# Patient Record
Sex: Female | Born: 1970 | Race: Black or African American | Hispanic: No | Marital: Single | Smoking: Former smoker
Health system: Southern US, Community
[De-identification: ages and names within clinical notes are randomized; demographics above are authoritative.]

## PROBLEM LIST (undated history)

## (undated) DIAGNOSIS — E782 Mixed hyperlipidemia: Secondary | ICD-10-CM

## (undated) DIAGNOSIS — D509 Iron deficiency anemia, unspecified: Secondary | ICD-10-CM

## (undated) DIAGNOSIS — I10 Essential (primary) hypertension: Secondary | ICD-10-CM

## (undated) DIAGNOSIS — Z6841 Body Mass Index (BMI) 40.0 and over, adult: Secondary | ICD-10-CM

## (undated) DIAGNOSIS — D251 Intramural leiomyoma of uterus: Secondary | ICD-10-CM

## (undated) HISTORY — PX: OTHER SURGICAL HISTORY: SHX169

## (undated) HISTORY — PX: ABDOMINAL HYSTERECTOMY: SHX81

---

## 2012-02-23 ENCOUNTER — Other Ambulatory Visit (HOSPITAL_COMMUNITY): Payer: Self-pay | Admitting: Obstetrics and Gynecology

## 2012-02-23 DIAGNOSIS — O09529 Supervision of elderly multigravida, unspecified trimester: Secondary | ICD-10-CM

## 2012-02-23 DIAGNOSIS — Z3689 Encounter for other specified antenatal screening: Secondary | ICD-10-CM

## 2012-02-23 DIAGNOSIS — E669 Obesity, unspecified: Secondary | ICD-10-CM

## 2012-03-01 ENCOUNTER — Other Ambulatory Visit: Payer: Self-pay

## 2012-03-01 ENCOUNTER — Encounter (HOSPITAL_COMMUNITY): Payer: Self-pay

## 2012-03-01 ENCOUNTER — Ambulatory Visit (HOSPITAL_COMMUNITY)
Admission: RE | Admit: 2012-03-01 | Discharge: 2012-03-01 | Disposition: A | Discharge status: Home / Self Care | Payer: Medicaid Other | Source: Ambulatory Visit | Attending: Obstetrics and Gynecology | Admitting: Obstetrics and Gynecology

## 2012-03-01 DIAGNOSIS — O10019 Pre-existing essential hypertension complicating pregnancy, unspecified trimester: Secondary | ICD-10-CM

## 2012-03-01 DIAGNOSIS — O09529 Supervision of elderly multigravida, unspecified trimester: Secondary | ICD-10-CM | POA: Insufficient documentation

## 2012-03-01 DIAGNOSIS — Z3689 Encounter for other specified antenatal screening: Secondary | ICD-10-CM

## 2012-03-01 DIAGNOSIS — IMO0002 Reserved for concepts with insufficient information to code with codable children: Secondary | ICD-10-CM | POA: Insufficient documentation

## 2012-03-01 DIAGNOSIS — O99210 Obesity complicating pregnancy, unspecified trimester: Secondary | ICD-10-CM | POA: Insufficient documentation

## 2012-03-01 DIAGNOSIS — E669 Obesity, unspecified: Secondary | ICD-10-CM

## 2012-03-01 HISTORY — DX: Essential (primary) hypertension: I10

## 2012-03-01 NOTE — Progress Notes (Signed)
Genetic Counseling  High-Risk Gestation Note  Appointment Date:  03/01/2012 Referred By: Earleen Newport, MD Date of Birth:  1970-05-31  Pregnancy History: [id number] Estimated Date of Delivery: 07/31/12 Estimated Gestational Age: [redacted]w[redacted]d Attending: Particia Nearing, MD  I met with Ms. Sandra Knight for genetic counseling because of a maternal age of 41 y.o..     She was counseled regarding maternal age and the association with risk for chromosome conditions due to nondisjunction with aging of the ova.   We reviewed chromosomes, nondisjunction, and the associated 1 in 23 risk for fetal aneuploidy related to a maternal age of 41 y.o. at [redacted]w[redacted]d gestation.  She was counseled that the risk for aneuploidy decreases as gestational age increases, accounting for those pregnancies which spontaneously abort.  We specifically discussed Down syndrome (trisomy 95), trisomies 59 and 51, and sex chromosome aneuploidies (47,XXX and 47,XXY) including the common features and prognoses of each.   We reviewed available screening options including Quad screen, noninvasive prenatal testing (NIPT), and detailed ultrasound. She understands that screening tests are used to modify a patient's a priori risk for aneuploidy, typically based on age.  This estimate provides a pregnancy specific risk assessment.  Specifically, we discussed that NIPT analyzes cell free fetal DNA found in the maternal circulation. This test is not diagnostic for chromosome conditions, but can provide information regarding the presence or absence of extra fetal DNA for chromosomes 13, 18, 21, X, and Y, and missing fetal DNA for chromosome X (Turner syndrome) and Y. Thus, it would not identify or rule out all genetic conditions. The reported detection rate is greater than 99% for Trisomy 21, greater than 98% for Trisomy 18, and is approximately 80% (8 out of 10) for Trisomy 13. The false positive rate is reported to be less than 0.1% for any of these conditions.   In addition, we discussed that ~50-80% of fetuses with Down syndrome and up to 90-95% of fetuses with trisomy 18/13, when well visualized, have detectable anomalies or soft markers by detailed ultrasound (~18+ weeks gestation).   She was also counseled regarding diagnostic testing via amniocentesis.  We reviewed the approximate 1 in 300-500 risk for complications for amniocentesis, including spontaneous pregnancy loss. We discussed the risks, limitations, and benefits of each screening and testing option. After consideration of all the options, and a clear understanding of the newness and limitations of NIPT, she elected to proceed with cell free fetal DNA testing. She reported that she had previously had her blood drawn at her primary OB's office for [facility], but that a result was not obtained.  We discussed that results from this redraw should be available in ~8-10 days.  She also expressed interest in having a detailed ultrasound.  A complete ultrasound was performed today.  The ultrasound report will be documented separately. She understands that ultrasound cannot rule out all birth defects or genetic syndromes. The patient was advised of this limitation and states she still does not want diagnostic testing at this time.   Ms. Zaffino was provided with written information regarding sickle cell anemia (SCA) including the carrier frequency and incidence in the African-American population, the availability of carrier testing and prenatal diagnosis if indicated.  In addition, we discussed that hemoglobinopathies are routinely screened for as part of the [facility] newborn screening panel.  She declined hemoglobin electrophoresis today.  Both family histories were reviewed and found to be noncontributory for birth defects, mental retardation, and known genetic conditions. Without further information regarding the provided family  history, an accurate genetic risk cannot be calculated. Further genetic counseling is  warranted if more information is obtained.  Ms. Konecny denied exposure to environmental toxins or chemical agents. She denied the use of alcohol, tobacco or street drugs. She denied significant viral illnesses during the course of her pregnancy. Her medical and surgical histories were contributory for a prior TAB, two prior c/s, CHTN, and obesity.   I counseled Ms. Towry-Tagoe regarding the above risks and available options.  The approximate face-to-face time with the genetic counselor was 42 minutes.  Donald Prose, MS Certified Genetic Counselor

## 2012-03-20 ENCOUNTER — Telehealth (HOSPITAL_COMMUNITY): Payer: Self-pay

## 2012-03-20 NOTE — Telephone Encounter (Signed)
Called Sandra Knight to discuss her [facility], cell free fetal DNA testing. Testing was offered because of a maternal age of 68.  The patient was identified by name and DOB. We discussed that the sample did not yield a result due to a low percentage of fetal DNA fraction.  This is the second failed sample.  We discussed that there is usually a second peak for fetal DNA at ~25-[redacted] weeks gestation.  Sandra Knight declined further testing at this time.  She will return for a follow up ultrasound to complete the fetal anatomy.  All questions were answered to her satisfaction, she was encouraged to call with additional questions or concerns.  Despina Arias, MS Certified Genetic Counselor

## 2012-04-12 ENCOUNTER — Ambulatory Visit (HOSPITAL_COMMUNITY)
Admission: RE | Admit: 2012-04-12 | Discharge: 2012-04-12 | Disposition: A | Discharge status: Home / Self Care | Payer: Medicaid Other | Source: Ambulatory Visit | Attending: Obstetrics and Gynecology | Admitting: Obstetrics and Gynecology

## 2012-04-12 DIAGNOSIS — E669 Obesity, unspecified: Secondary | ICD-10-CM | POA: Insufficient documentation

## 2012-04-12 DIAGNOSIS — O10019 Pre-existing essential hypertension complicating pregnancy, unspecified trimester: Secondary | ICD-10-CM | POA: Insufficient documentation

## 2012-04-12 DIAGNOSIS — O09529 Supervision of elderly multigravida, unspecified trimester: Secondary | ICD-10-CM | POA: Insufficient documentation

## 2012-04-12 DIAGNOSIS — O99210 Obesity complicating pregnancy, unspecified trimester: Secondary | ICD-10-CM | POA: Insufficient documentation

## 2012-04-12 DIAGNOSIS — O34219 Maternal care for unspecified type scar from previous cesarean delivery: Secondary | ICD-10-CM | POA: Insufficient documentation

## 2012-04-12 DIAGNOSIS — Z3689 Encounter for other specified antenatal screening: Secondary | ICD-10-CM | POA: Insufficient documentation

## 2012-04-12 NOTE — Progress Notes (Signed)
Sandra Knight  was seen today for an ultrasound appointment.  See full report in [facility].  Single IUP at 24 2/7 weeks Limited views of the fetal heart and face again obtained due to maternal body habitus No anomalies identified Normal amniotic fluid volume Interval growth is appropriate (57th %tile)  NIPT ([facility] test) returned with insufficient fetal fraction x 2.  The patient declines further aneuploidy testing.  Recommend follow-up ultrasound examination in 4 weeks for interval growth and to reevaluate the fetal heart and face  Alpha Gula, MD

## 2012-05-09 ENCOUNTER — Other Ambulatory Visit (HOSPITAL_COMMUNITY): Payer: Self-pay | Admitting: Obstetrics and Gynecology

## 2012-05-09 DIAGNOSIS — O09529 Supervision of elderly multigravida, unspecified trimester: Secondary | ICD-10-CM

## 2012-05-09 DIAGNOSIS — O99210 Obesity complicating pregnancy, unspecified trimester: Secondary | ICD-10-CM

## 2012-05-10 ENCOUNTER — Ambulatory Visit (HOSPITAL_COMMUNITY)
Admission: RE | Admit: 2012-05-10 | Discharge: 2012-05-10 | Disposition: A | Discharge status: Home / Self Care | Payer: Medicaid Other | Source: Ambulatory Visit | Attending: Obstetrics and Gynecology | Admitting: Obstetrics and Gynecology

## 2012-05-10 DIAGNOSIS — O34219 Maternal care for unspecified type scar from previous cesarean delivery: Secondary | ICD-10-CM | POA: Insufficient documentation

## 2012-05-10 DIAGNOSIS — O09529 Supervision of elderly multigravida, unspecified trimester: Secondary | ICD-10-CM | POA: Insufficient documentation

## 2012-05-10 DIAGNOSIS — E669 Obesity, unspecified: Secondary | ICD-10-CM | POA: Insufficient documentation

## 2012-05-10 DIAGNOSIS — O99210 Obesity complicating pregnancy, unspecified trimester: Secondary | ICD-10-CM

## 2012-05-10 DIAGNOSIS — O10019 Pre-existing essential hypertension complicating pregnancy, unspecified trimester: Secondary | ICD-10-CM | POA: Insufficient documentation

## 2012-05-10 NOTE — Progress Notes (Signed)
Sandra Knight  was seen today for an ultrasound appointment.  See full report in [facility].  Impression: Single IUP at 28 2/7 weeks Normal interval anatomy - some views of the fetal heart limited due to maternal body habitus (LVOT, arches) Interval growth is appropriate (53rd %tile) Normal amniotic fluid volume.  Recommendations: Recommend follow-up ultrasound examination in 4 weeks for interval growth.    Alpha Gula, MD

## 2012-06-06 ENCOUNTER — Other Ambulatory Visit (HOSPITAL_COMMUNITY): Payer: Self-pay | Admitting: Obstetrics and Gynecology

## 2012-06-06 DIAGNOSIS — O09529 Supervision of elderly multigravida, unspecified trimester: Secondary | ICD-10-CM

## 2012-06-07 ENCOUNTER — Ambulatory Visit (HOSPITAL_COMMUNITY): Payer: Medicaid Other

## 2012-06-09 ENCOUNTER — Ambulatory Visit (HOSPITAL_COMMUNITY): Payer: Medicaid Other

## 2012-06-15 ENCOUNTER — Ambulatory Visit (HOSPITAL_COMMUNITY)
Admission: RE | Admit: 2012-06-15 | Discharge: 2012-06-15 | Disposition: A | Discharge status: Home / Self Care | Payer: Medicaid Other | Source: Ambulatory Visit | Attending: Obstetrics and Gynecology | Admitting: Obstetrics and Gynecology

## 2012-06-15 DIAGNOSIS — O09529 Supervision of elderly multigravida, unspecified trimester: Secondary | ICD-10-CM | POA: Insufficient documentation

## 2012-06-15 DIAGNOSIS — O99210 Obesity complicating pregnancy, unspecified trimester: Secondary | ICD-10-CM | POA: Insufficient documentation

## 2012-06-15 DIAGNOSIS — O10019 Pre-existing essential hypertension complicating pregnancy, unspecified trimester: Secondary | ICD-10-CM | POA: Insufficient documentation

## 2012-06-15 DIAGNOSIS — E669 Obesity, unspecified: Secondary | ICD-10-CM | POA: Insufficient documentation

## 2012-06-15 DIAGNOSIS — O34219 Maternal care for unspecified type scar from previous cesarean delivery: Secondary | ICD-10-CM | POA: Insufficient documentation

## 2012-06-15 NOTE — Progress Notes (Signed)
Sandra Knight  was seen today for an ultrasound appointment.  See full report in [facility].  Impression: Single IUP at 33 3/7 weeks Normal interval anatomy - some views of the fetal heart limited due to maternal body habitus (LVOT, arches) Interval growth is appropriate (78th %tile) Normal amniotic fluid volume.  Recommendations: Follow-up ultrasounds as clinically indicated.   Alpha Gula, MD

## 2013-01-04 ENCOUNTER — Encounter (HOSPITAL_COMMUNITY): Payer: Self-pay | Admitting: *Deleted

## 2014-01-21 ENCOUNTER — Encounter (HOSPITAL_COMMUNITY): Payer: Self-pay | Admitting: *Deleted

## 2014-12-06 IMAGING — US US OB DETAIL+14 WK
1 series · 12 of 28 positions shown · non-contrast
Comparison: none

[Series 1: us ob detail+14 wk · 53 acquisitions, 12 frames shown]
[im 2/53]
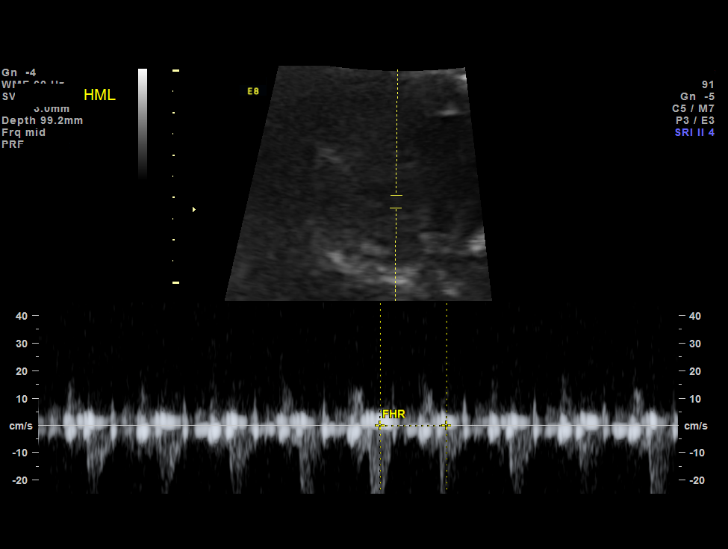
[im 6/53]
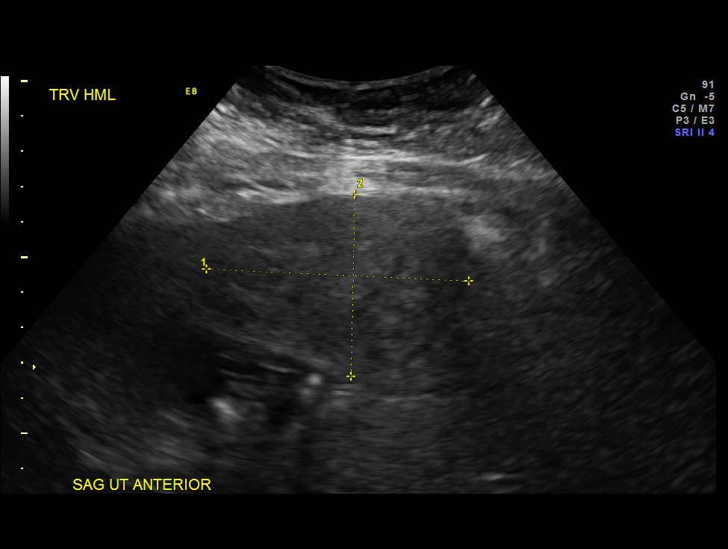
[im 10/53]
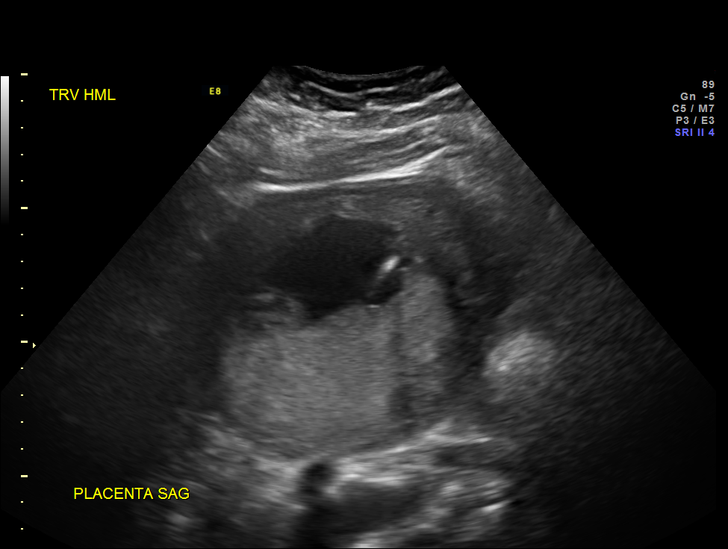
[im 16/53]
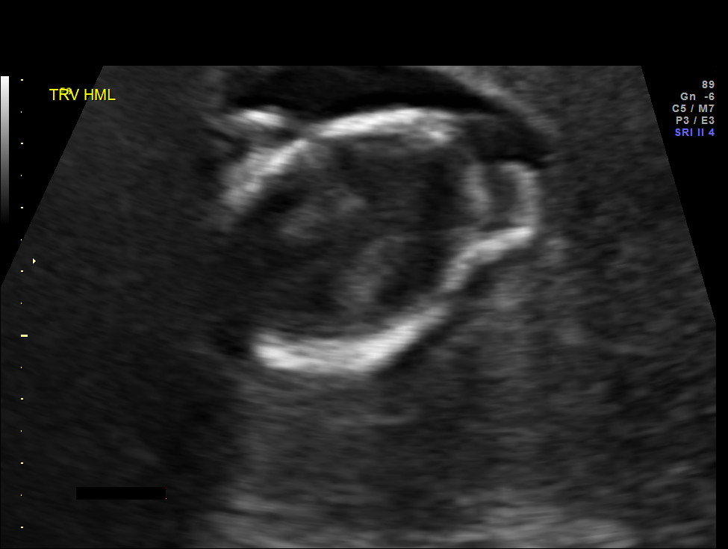
[im 20/53]
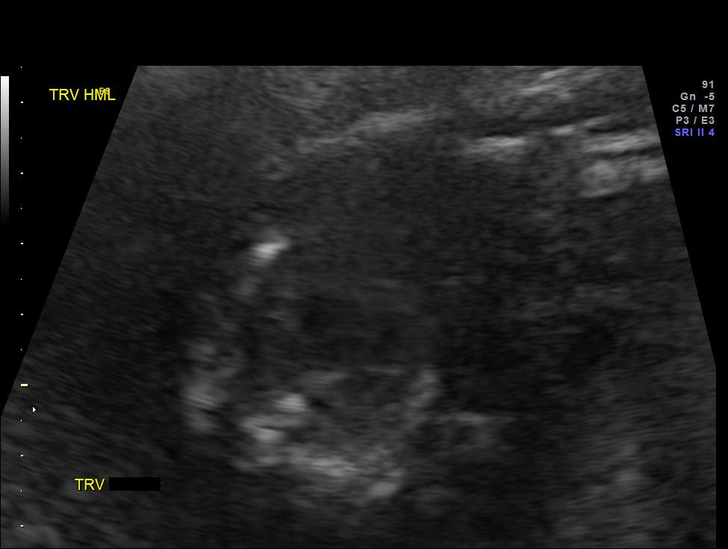
[im 24/53]
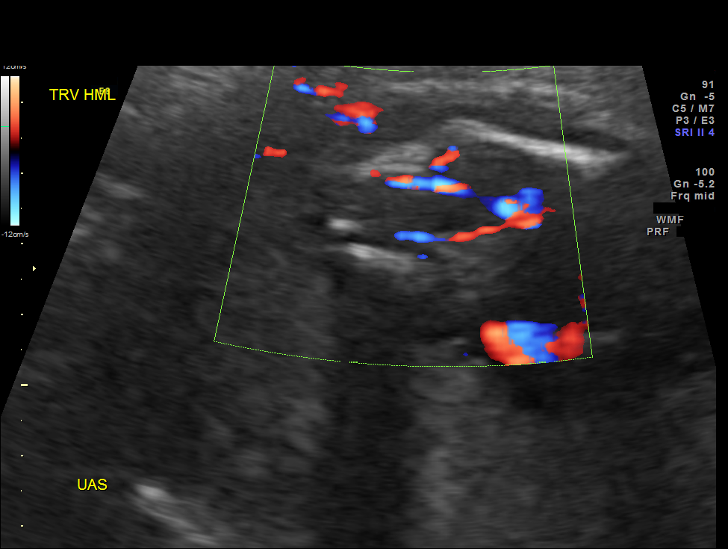
[im 29/53]
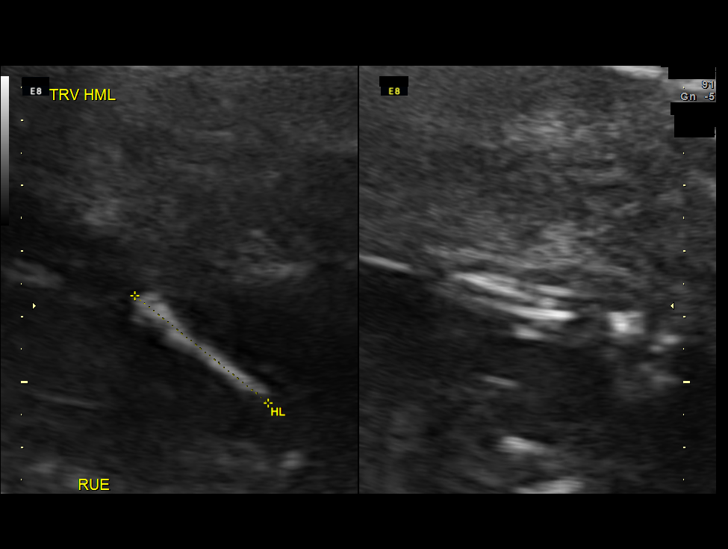
[im 33/53]
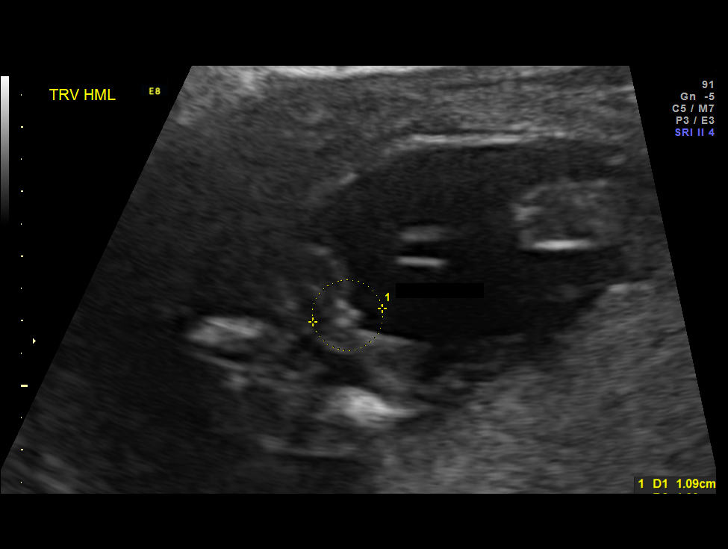
[im 37/53]
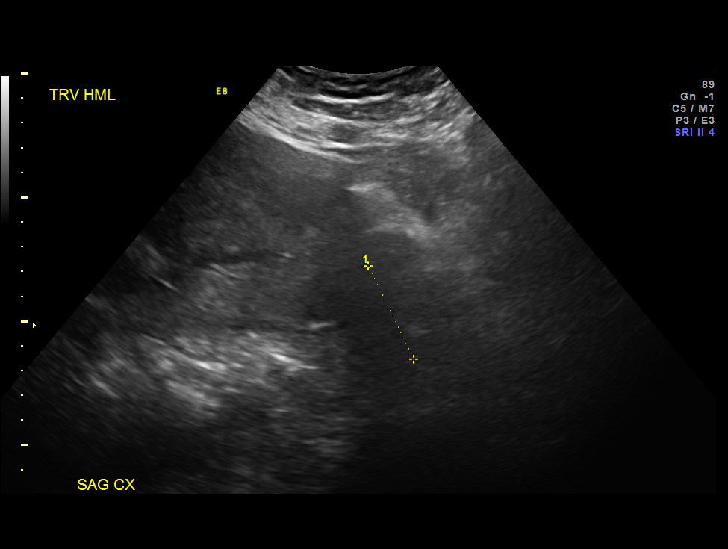
[im 43/53]
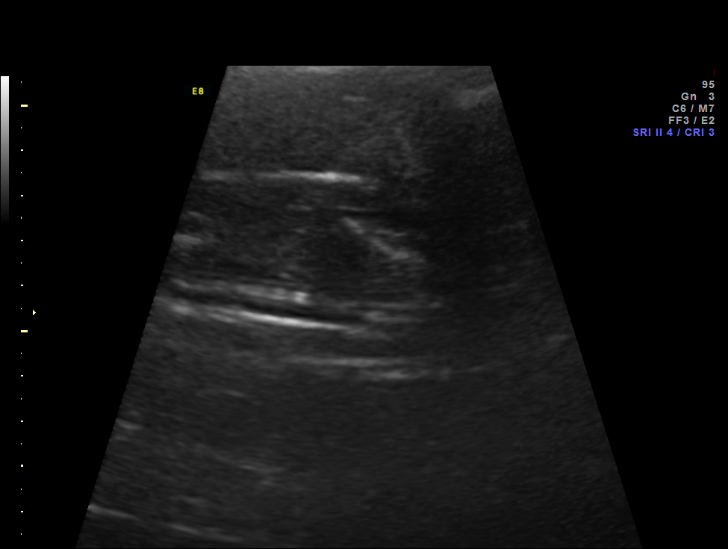
[im 47/53]
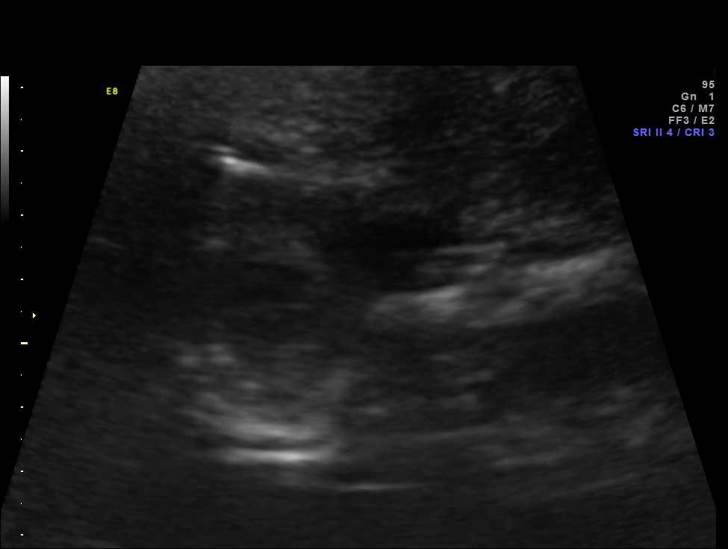
[im 51/53]
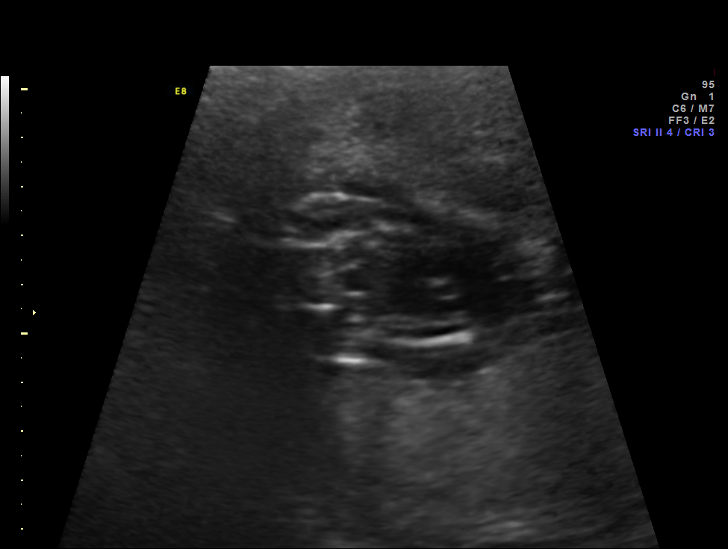

[12 of 28 positions shown; findings below may reference images not displayed]

OBSTETRICS REPORT
                      (Signed Final 03/01/2012 [DATE])

Service(s) Provided

 US OB DETAIL + 14 WK                                  76811.0
Indications

 Detailed fetal anatomic survey
 Hypertension - Chronic/Pre-existing
 Advanced maternal age (AMA), Multigravida (41         [AGE]/o)
 Maternal morbid obesity (398 lb)
 Previous cesarean section x 2
Fetal Evaluation

 Num Of Fetuses:    1
 Fetal Heart Rate:  152                          bpm
 Cardiac Activity:  Observed
 Presentation:      Transverse, head to
                    maternal left
 Placenta:          Posterior, above cervical
                    os
 P. Cord            Not well visualized
 Insertion:

 Amniotic Fluid
 AFI FV:      Subjectively within normal limits
                                             Larg Pckt:     3.1  cm
Biometry

 BPD:     36.4  mm     G. Age:  17w 1d                CI:         69.5   70 - 86
 OFD:     52.4  mm                                    FL/HC:      19.2   15.8 -
                                                                         18
 HC:     144.1  mm     G. Age:  17w 4d       15  %    HC/AC:      1.13   1.07 -

 AC:     127.7  mm     G. Age:  18w 3d       49  %    FL/BPD:
 FL:      27.6  mm     G. Age:  18w 3d       50  %    FL/AC:      21.6   20 - 24
 HUM:     26.9  mm     G. Age:  18w 4d       62  %
 CER:     18.9  mm     G. Age:  18w 3d       55  %
 NFT:      4.3  mm

 Est. FW:     232  gm      0 lb 8 oz     47  %
Gestational Age
 LMP:           18w 2d        Date:  10/25/11                 EDD:   07/31/12
 U/S Today:     17w 6d                                        EDD:   08/03/12
 Best:          18w 2d     Det. By:  LMP  (10/25/11)          EDD:   07/31/12
Anatomy

 Cranium:          Appears normal         Aortic Arch:      Not well visualized
 Fetal Cavum:      Not well visualized    Ductal Arch:      Not well visualized
 Ventricles:       Appears normal         Diaphragm:        Not well visualized
 Choroid Plexus:   Appears normal         Stomach:          Appears normal
 Cerebellum:       Appears normal         Abdomen:          Appears normal
 Posterior Fossa:  Appears normal         Abdominal Wall:   Not well visualized
 Nuchal Fold:      Appears normal         Cord Vessels:     Appears normal (3
                                                            vessel cord)
 Face:             Not well visualized    Kidneys:          Not well visualized
 Lips:             Not well visualized    Bladder:          Appears normal
 Heart:            Not well visualized    Spine:            Not well visualized
 RVOT:             Not well visualized    Lower             Visualized
                                          Extremities:
 LVOT:             Not well visualized    Upper             Visualized
                                          Extremities:

 Other:  Fetus appears to be a female. Technically difficult due to maternal
         habitus and fetal position.
Targeted Anatomy

 Fetal Central Nervous System
 Cisterna Magna:
Cervix Uterus Adnexa

 Cervix:       Normal appearance by transabdominal scan.

 Adnexa:     No abnormality visualized.
Myomas

 Site                     L(cm)      W(cm)       D(cm)      Location
 Anterior

 Blood Flow                  RI       PI       Comments

Impression

 IUP at 18+2 weeks
 Normal but limited detailed fetal anatomy; heart, spine,
 abdominal wall, CSP and kidneys not optimally visualized
 Markers of aneuploidy: none
 Normal amniotic fluid volume
 Measurements consistent with LMP dating
 Fibroid uterus: see above for size and location

 Please see genetic counseling note. Ms. Rosine Thrower decided
 to have her cffDNA repeated today.
Recommendations

 Follow-up ultrasound in 6 weeks to complete anatomy survey
 and to assess fetal growth

## 2015-03-22 IMAGING — US US OB FOLLOW-UP
1 series · 12 of 23 positions shown · non-contrast
Comparison: none

[Series 1: us ob follow-up · 0.28mm/px · 12 of 23 slices shown]
[im 1/23]
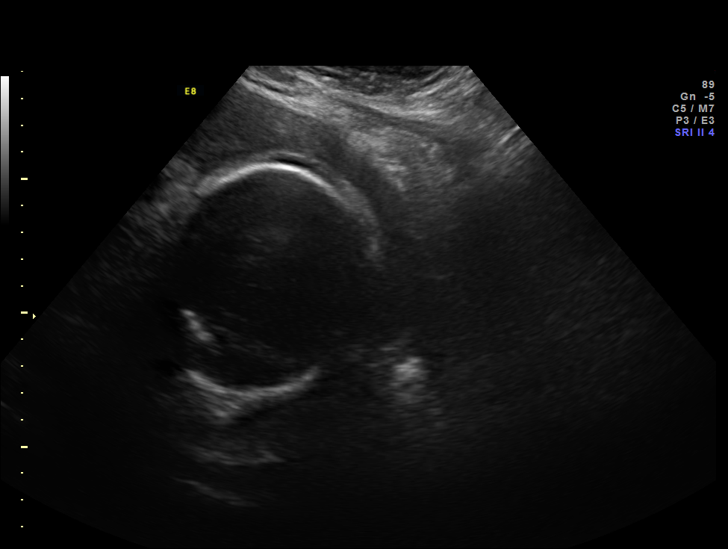
[im 3/23]
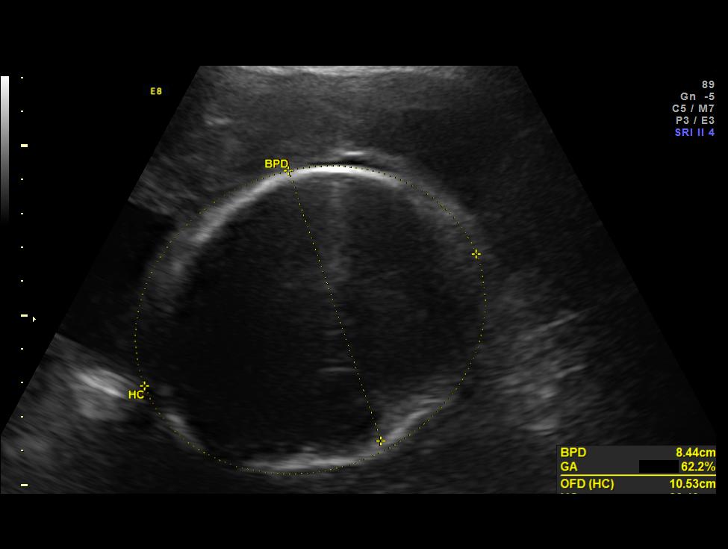
[im 5/23]
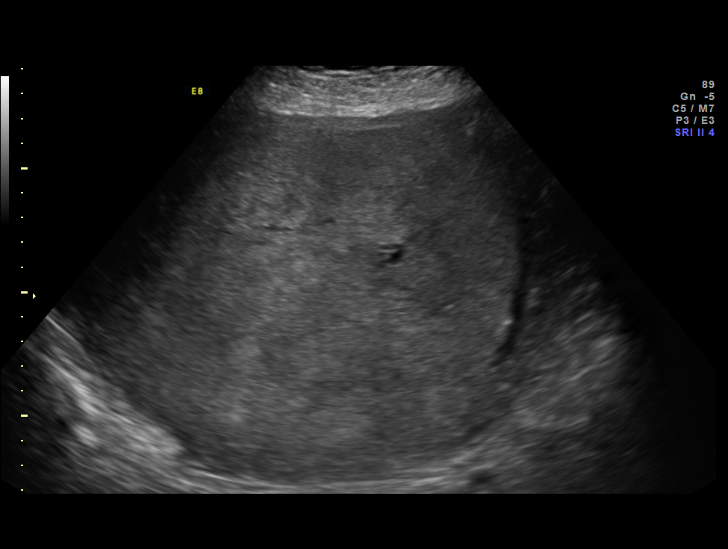
[im 7/23]
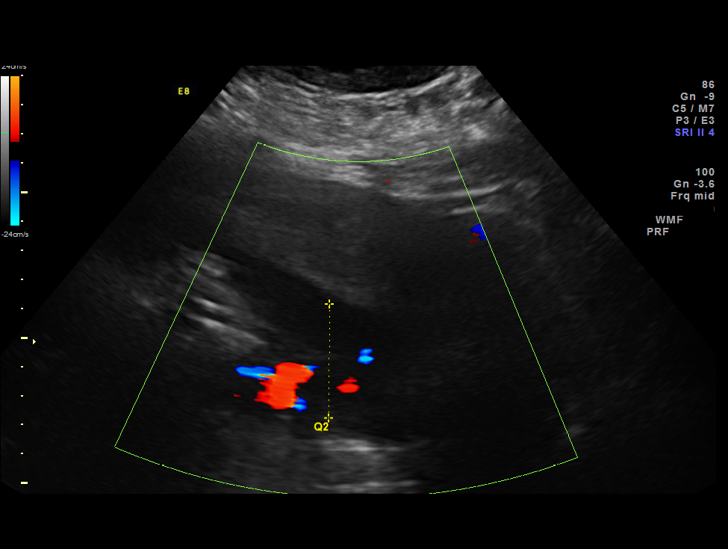
[im 9/23]
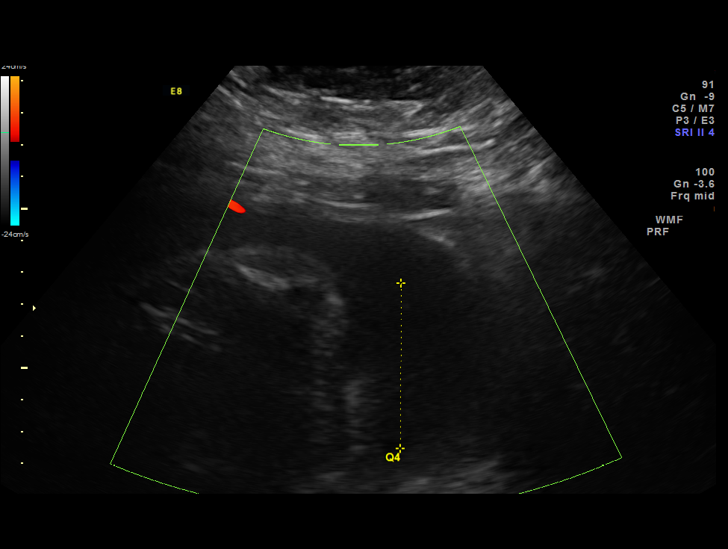
[im 11/23]
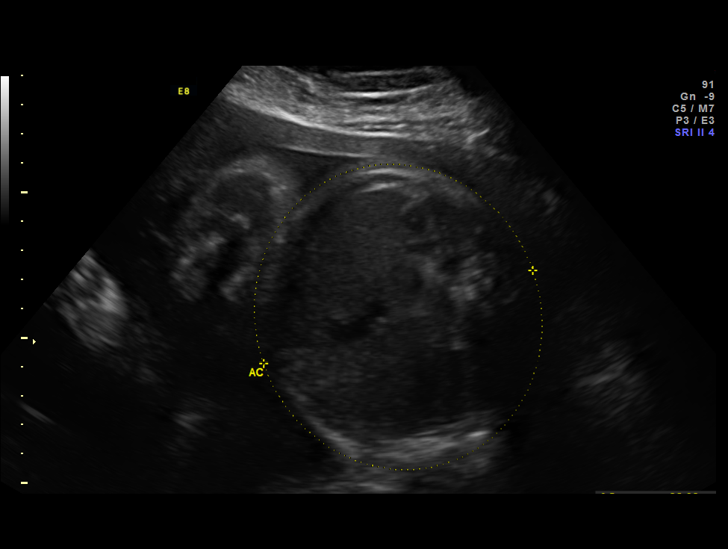
[im 13/23]
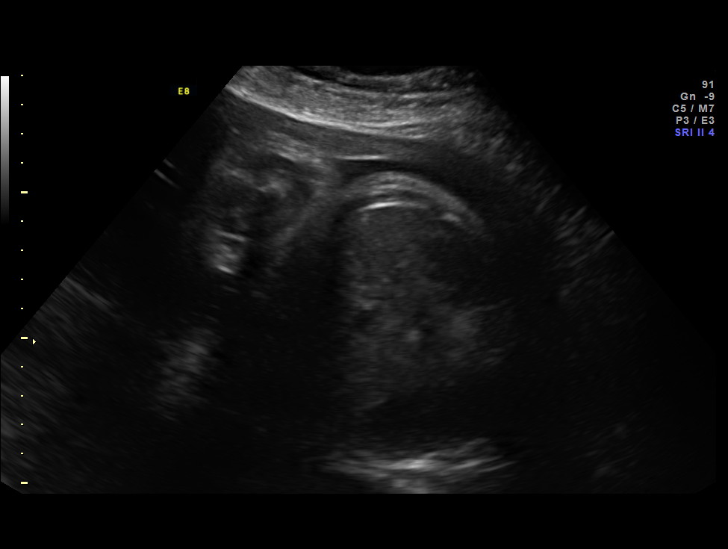
[im 15/23]
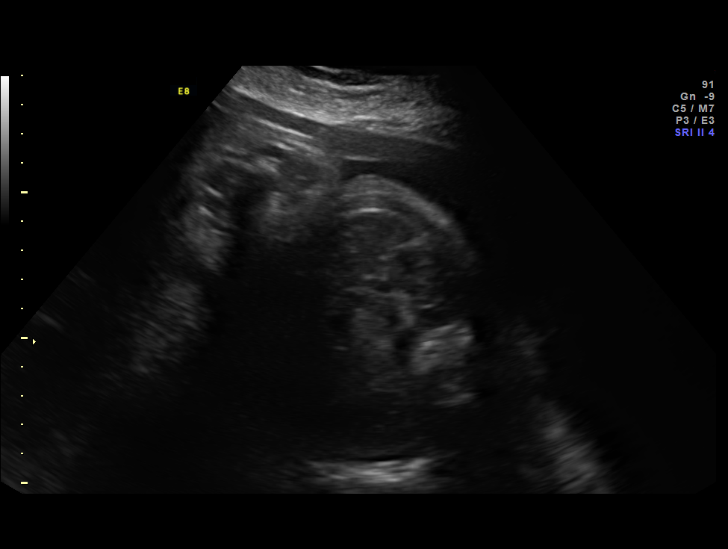
[im 17/23]
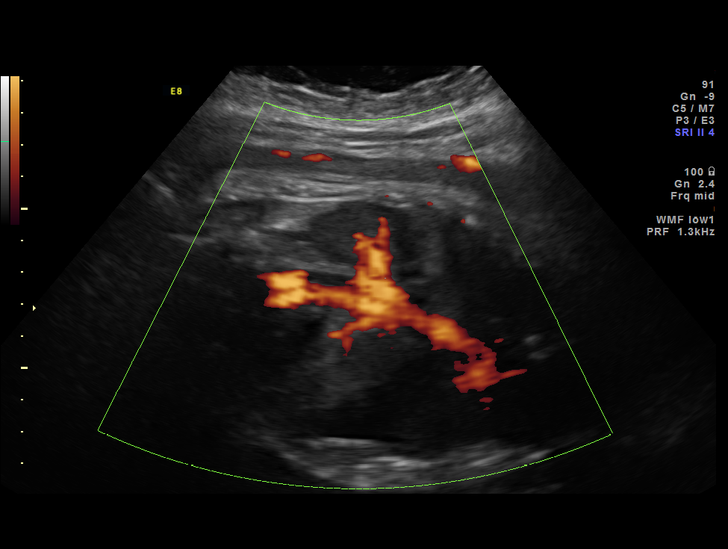
[im 19/23]
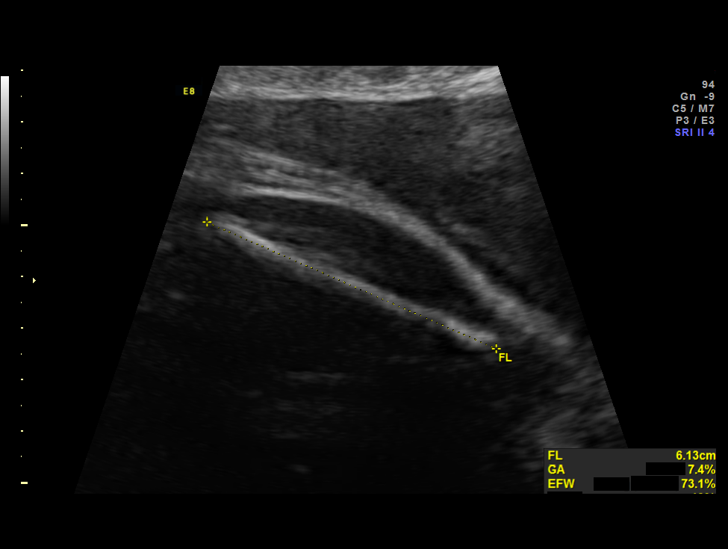
[im 21/23]
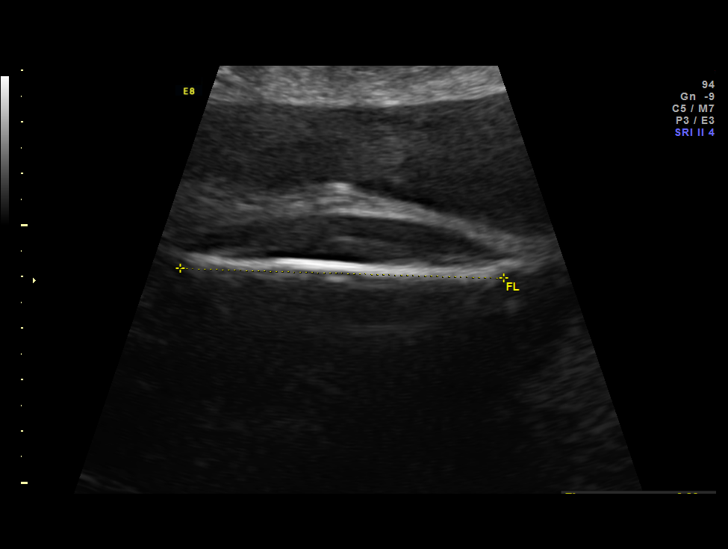
[im 23/23]
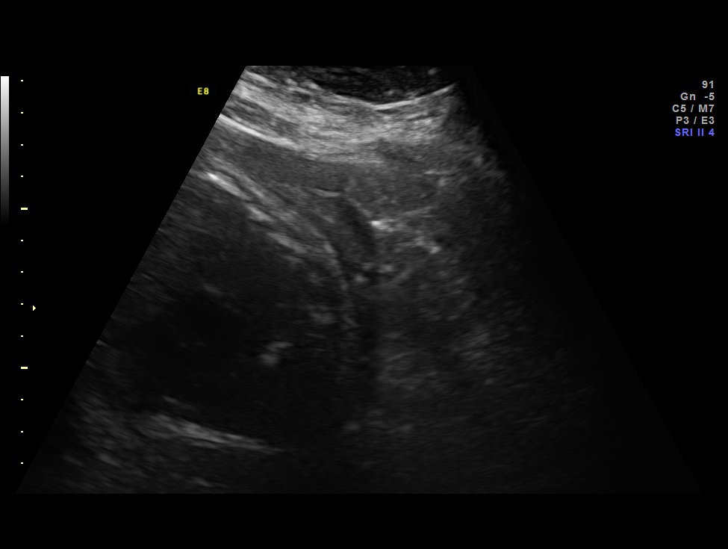

[12 of 23 positions shown; findings below may reference images not displayed]

OBSTETRICS REPORT
                      (Signed Final 06/15/2012 [DATE])

Service(s) Provided

 US OB FOLLOW UP                                       76816.1
Indications

 Hypertension - Chronic/Pre-existing
 Advanced maternal age (AMA), Multigravida (41         [AGE]/o)
 Maternal morbid obesity (398 lb)
 Previous cesarean section x 2
Fetal Evaluation

 Num Of Fetuses:    1
 Fetal Heart Rate:  141                          bpm
 Cardiac Activity:  Observed
 Presentation:      Cephalic
 Placenta:          Posterior, above cervical
                    os

 Amniotic Fluid
 AFI FV:      Subjectively within normal limits
 AFI Sum:     13.42   cm       44  %Tile     Larg Pckt:    5.19  cm
 RUQ:   2.84    cm   RLQ:    5.19   cm    LUQ:   3.92    cm   LLQ:    1.47   cm
Biometry

 BPD:     84.6  mm     G. Age:  34w 0d                CI:         80.0   70 - 86
 OFD:    105.7  mm                                    FL/HC:      20.6   19.9 -

 HC:     305.1  mm     G. Age:  33w 6d       26  %    HC/AC:      0.96   0.96 -

 AC:     319.1  mm     G. Age:  35w 6d     > 97  %    FL/BPD:     74.3   71 - 87
 FL:      62.9  mm     G. Age:  32w 4d       19  %    FL/AC:      19.7   20 - 24

 Est. FW:    6117  gm      5 lb 7 oz     78  %
Gestational Age

 LMP:           33w 3d        Date:  10/25/11                 EDD:   07/31/12
 U/S Today:     34w 1d                                        EDD:   07/26/12
 Best:          33w 3d     Det. By:  LMP  (10/25/11)          EDD:   07/31/12
Anatomy
 Cranium:          Appears normal         Aortic Arch:      Not well visualized
 Fetal Cavum:      Previously seen        Ductal Arch:      Not well visualized
 Ventricles:       Previously seen        Diaphragm:        Previously seen
 Choroid Plexus:   Previously seen        Stomach:          Appears normal, left
                                                            sided
 Cerebellum:       Previously seen        Abdomen:          Appears normal
 Posterior Fossa:  Previously seen        Abdominal Wall:   Previously seen
 Nuchal Fold:      Previously seen        Cord Vessels:     Previously seen
 Face:             Orbits and profile     Kidneys:          Appear normal
                   previously seen
 Lips:             Previously seen        Bladder:          Appears normal
 Heart:            Previously seen        Spine:            Previously seen
 RVOT:             Not well visualized    Lower             Previously seen
                                          Extremities:
 LVOT:             Previously seen        Upper             Previously seen
                                          Extremities:

 Other:  Female gender previously seen. Technically difficult due to maternal
         habitus and fetal position.
Cervix Uterus Adnexa

 Cervix:       Not visualized (advanced GA >95wks)

 Adnexa:     No abnormality visualized.
Impression

 Single IUP at 33 [DATE] weeks
 Normal interval anatomy - some views of the fetal heart
 limited due to maternal body habitus (LVOT, arches)
 Interval growth is appropriate (78th %tile)
 Normal amniotic fluid volume.
Recommendations

 Follow-up ultrasounds as clinically indicated.

## 2016-01-02 ENCOUNTER — Emergency Department (HOSPITAL_BASED_OUTPATIENT_CLINIC_OR_DEPARTMENT_OTHER): Payer: No Typology Code available for payment source

## 2016-01-02 ENCOUNTER — Emergency Department (HOSPITAL_BASED_OUTPATIENT_CLINIC_OR_DEPARTMENT_OTHER)
Admission: EM | Admit: 2016-01-02 | Discharge: 2016-01-02 | Disposition: A | Discharge status: Home / Self Care | Payer: No Typology Code available for payment source | Attending: Emergency Medicine | Admitting: Emergency Medicine

## 2016-01-02 ENCOUNTER — Encounter (HOSPITAL_BASED_OUTPATIENT_CLINIC_OR_DEPARTMENT_OTHER): Payer: Self-pay | Admitting: *Deleted

## 2016-01-02 DIAGNOSIS — Z79899 Other long term (current) drug therapy: Secondary | ICD-10-CM | POA: Diagnosis not present

## 2016-01-02 DIAGNOSIS — M25512 Pain in left shoulder: Secondary | ICD-10-CM | POA: Insufficient documentation

## 2016-01-02 DIAGNOSIS — Y999 Unspecified external cause status: Secondary | ICD-10-CM | POA: Insufficient documentation

## 2016-01-02 DIAGNOSIS — Y939 Activity, unspecified: Secondary | ICD-10-CM | POA: Diagnosis not present

## 2016-01-02 DIAGNOSIS — Z87891 Personal history of nicotine dependence: Secondary | ICD-10-CM | POA: Insufficient documentation

## 2016-01-02 DIAGNOSIS — Y92410 Unspecified street and highway as the place of occurrence of the external cause: Secondary | ICD-10-CM | POA: Diagnosis not present

## 2016-01-02 DIAGNOSIS — I10 Essential (primary) hypertension: Secondary | ICD-10-CM | POA: Diagnosis not present

## 2016-01-02 MED ORDER — NAPROXEN 500 MG PO TABS: 500.0000 mg | ORAL_TABLET | Freq: Two times a day (BID) | ORAL | 0 refills | Status: DC

## 2016-01-02 MED ORDER — ACETAMINOPHEN 325 MG PO TABS
650.0000 mg | ORAL_TABLET | Freq: Once | ORAL | Status: AC
  Administered 2016-01-02: 650 mg via ORAL
  Filled 2016-01-02: qty 2

## 2016-01-02 MED ORDER — IBUPROFEN 800 MG PO TABS
800.0000 mg | ORAL_TABLET | Freq: Once | ORAL | Status: AC
  Administered 2016-01-02: 800 mg via ORAL
  Filled 2016-01-02: qty 1
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
## 2016-02-07 ENCOUNTER — Emergency Department (HOSPITAL_BASED_OUTPATIENT_CLINIC_OR_DEPARTMENT_OTHER)
Admission: EM | Admit: 2016-02-07 | Discharge: 2016-02-07 | Disposition: A | Discharge status: Home / Self Care | Payer: Medicaid Other | Attending: Emergency Medicine | Admitting: Emergency Medicine

## 2016-02-07 ENCOUNTER — Encounter (HOSPITAL_BASED_OUTPATIENT_CLINIC_OR_DEPARTMENT_OTHER): Payer: Self-pay | Admitting: Emergency Medicine

## 2016-02-07 DIAGNOSIS — J069 Acute upper respiratory infection, unspecified: Secondary | ICD-10-CM

## 2016-02-07 DIAGNOSIS — Z79899 Other long term (current) drug therapy: Secondary | ICD-10-CM | POA: Insufficient documentation

## 2016-02-07 DIAGNOSIS — Z87891 Personal history of nicotine dependence: Secondary | ICD-10-CM | POA: Insufficient documentation

## 2016-02-07 DIAGNOSIS — I10 Essential (primary) hypertension: Secondary | ICD-10-CM | POA: Insufficient documentation

## 2016-02-07 NOTE — ED Provider Notes (Signed)
[facility] DEPT [facility] Provider Note   CSN: [id number] Arrival date & time: 02/07/16  2019  By signing my name below, I, [name], attest that this documentation has been prepared under the direction and in the presence of Malvin Johns, MD . Electronically Signed: [name], Scribe. 02/07/2016. 8:33 PM.  History   Chief Complaint Chief Complaint  Patient presents with  . Generalized Body Aches  . Nasal Congestion   The history is provided by the patient. No language interpreter was used.   HPI Comments: Sandra Knight is a 45 y.o. female who presents to the Emergency Department complaining of constant moderate nasal congestion that started 2 days ago. She states she has been having gradually worsening URI-like symptoms. She reports associated generalized myalgias. She reports no modifying factors. She denies any chest pain, fever, or SOB.   Past Medical History:  Diagnosis Date  . Hypertension     There are no active problems to display for this patient.   Past Surgical History:  Procedure Laterality Date  . ABDOMINAL HYSTERECTOMY    . CESAREAN SECTION      OB History    Gravida Para Term Preterm AB Living   5 2 2  0 2 0   SAB TAB Ectopic Multiple Live Births   1 1 0 0         Home Medications    Prior to Admission medications   Medication Sig Start Date End Date Taking? Authorizing Provider  furosemide (LASIX) 40 MG tablet Take 40 mg by mouth.   Yes Historical Provider, MD  lisinopril (PRINIVIL,ZESTRIL) 20 MG tablet Take 20 mg by mouth daily.   Yes Historical Provider, MD  metoprolol (LOPRESSOR) 50 MG tablet Take 50 mg by mouth 2 (two) times daily.   Yes Historical Provider, MD  naproxen (NAPROSYN) 500 MG tablet Take 1 tablet (500 mg total) by mouth 2 (two) times daily. 01/02/16   Gloriann Loan, PA-C    Family History No family history on file.  Social History Social History  Substance Use Topics  . Smoking status: Former Research scientist (life sciences)  . Smokeless tobacco:  Never Used  . Alcohol use No     Allergies   Patient has no known allergies.   Review of Systems Review of Systems  Constitutional: Positive for fatigue. Negative for chills, diaphoresis and fever.  HENT: Positive for congestion (Nasal), postnasal drip, rhinorrhea and sore throat. Negative for sneezing.   Eyes: Negative.   Respiratory: Positive for cough. Negative for chest tightness and shortness of breath.   Cardiovascular: Negative for chest pain and leg swelling.  Gastrointestinal: Negative for abdominal pain, blood in stool, diarrhea, nausea and vomiting.  Genitourinary: Negative for difficulty urinating, flank pain, frequency and hematuria.  Musculoskeletal: Positive for myalgias (Generalized). Negative for arthralgias and back pain.  Skin: Negative for rash.  Neurological: Negative for dizziness, speech difficulty, weakness, numbness and headaches.     Physical Exam Updated Vital Signs BP 159/90 (BP Location: Left Arm)   Pulse 79   Temp 98.4 F (36.9 C) (Oral)   Resp 20   Ht 5\' 8"  (1.727 m)   Wt (!) 380 lb (172.4 kg)   LMP 10/25/2011   SpO2 100%   BMI 57.78 kg/m   Physical Exam  Constitutional: She is oriented to person, place, and time. She appears well-developed and well-nourished.  HENT:  Head: Normocephalic and atraumatic.  Right Ear: External ear normal.  Left Ear: External ear normal.  Nose: Nose normal.  Mouth/Throat: Oropharynx  is clear and moist. No oropharyngeal exudate.  Eyes: Pupils are equal, round, and reactive to light.  Neck: Normal range of motion. Neck supple.  Cardiovascular: Normal rate, regular rhythm and normal heart sounds.   Pulmonary/Chest: Effort normal and breath sounds normal. No respiratory distress. She has no wheezes. She has no rales. She exhibits no tenderness.  Abdominal: Soft. Bowel sounds are normal. There is no tenderness. There is no rebound and no guarding.  Musculoskeletal: Normal range of motion. She exhibits no edema.   Lymphadenopathy:    She has no cervical adenopathy.  Neurological: She is alert and oriented to person, place, and time.  Skin: Skin is warm and dry. No rash noted.  Psychiatric: She has a normal mood and affect.     ED Treatments / Results  DIAGNOSTIC STUDIES: Oxygen Saturation is 100% on RA, normal by my interpretation.    COORDINATION OF CARE: 8:40 PM- Pt advised of plan for treatment and pt agrees.  Labs (all labs ordered are listed, but only abnormal results are displayed) Labs Reviewed - No data to display  EKG  EKG Interpretation None       Radiology No results found.  Procedures Procedures (including critical care time)  Medications Ordered in ED Medications - No data to display   Initial Impression / Assessment and Plan / ED Course  I have reviewed the triage vital signs and the nursing notes.  Pertinent labs & imaging results that were available during my care of the patient were reviewed by me and considered in my medical decision making (see chart for details).  Clinical Course     Patient presents with URI symptoms. She is well-appearing. There is no evidence of pneumonia. No evidence of other bacterial infection. She was advised in symptomatic care. She was advised to follow-up with her PCP or return here as needed for any worsening symptoms.  Final Clinical Impressions(s) / ED Diagnoses   Final diagnoses:  Viral upper respiratory tract infection    New Prescriptions New Prescriptions   No medications on file   I personally performed the services described in this documentation, which was scribed in my presence.  The recorded information has been reviewed and considered.     Malvin Johns, MD 02/07/16 2059

## 2016-02-07 NOTE — ED Triage Notes (Signed)
Pt states congestion and generalized body aches for 2 days.  Pt denies known fever at home.

## 2016-02-29 ENCOUNTER — Emergency Department (HOSPITAL_BASED_OUTPATIENT_CLINIC_OR_DEPARTMENT_OTHER)
Admission: EM | Admit: 2016-02-29 | Discharge: 2016-02-29 | Disposition: A | Discharge status: Home / Self Care | Payer: Self-pay | Attending: Emergency Medicine | Admitting: Emergency Medicine

## 2016-02-29 ENCOUNTER — Emergency Department (HOSPITAL_BASED_OUTPATIENT_CLINIC_OR_DEPARTMENT_OTHER): Payer: Self-pay

## 2016-02-29 ENCOUNTER — Encounter (HOSPITAL_BASED_OUTPATIENT_CLINIC_OR_DEPARTMENT_OTHER): Payer: Self-pay | Admitting: Emergency Medicine

## 2016-02-29 DIAGNOSIS — Z791 Long term (current) use of non-steroidal anti-inflammatories (NSAID): Secondary | ICD-10-CM | POA: Insufficient documentation

## 2016-02-29 DIAGNOSIS — Z79899 Other long term (current) drug therapy: Secondary | ICD-10-CM | POA: Insufficient documentation

## 2016-02-29 DIAGNOSIS — I10 Essential (primary) hypertension: Secondary | ICD-10-CM | POA: Insufficient documentation

## 2016-02-29 DIAGNOSIS — J069 Acute upper respiratory infection, unspecified: Secondary | ICD-10-CM | POA: Insufficient documentation

## 2016-02-29 DIAGNOSIS — Z87891 Personal history of nicotine dependence: Secondary | ICD-10-CM | POA: Insufficient documentation

## 2016-02-29 DIAGNOSIS — B9789 Other viral agents as the cause of diseases classified elsewhere: Secondary | ICD-10-CM

## 2016-02-29 MED ORDER — FLUTICASONE PROPIONATE 50 MCG/ACT NA SUSP: 2.0000 | Freq: Every day | NASAL | 2 refills | Status: AC

## 2016-02-29 MED ORDER — BENZONATATE 100 MG PO CAPS: 100.0000 mg | ORAL_CAPSULE | Freq: Three times a day (TID) | ORAL | 0 refills | Status: AC

## 2016-02-29 NOTE — Discharge Instructions (Signed)
Please read attached information. If you experience any new or worsening signs or symptoms please return to the emergency room for evaluation. Please follow-up with your primary care provider or specialist as discussed. Please use medication prescribed only as directed and discontinue taking if you have any concerning signs or symptoms.   °

## 2016-02-29 NOTE — ED Notes (Signed)
Okey Regal, PA-C in room with pt now.

## 2016-02-29 NOTE — ED Provider Notes (Signed)
[place] DEPT [facility] Provider Note   CSN: [id number] Arrival date & time: 02/29/16  1215     History   Chief Complaint Chief Complaint  Patient presents with  . Cough    HPI Sandra Knight is a 45 y.o. female.  HPI   22 show female presents today with complaints of cough. Patient reports that she was seen on November 18 for similar symptoms. She reports generalized body aches, cough, upper respiratory congestion. Patient notes that symptoms started to improve, but still had a cough. She notes several days ago she was out in the cold started developing worsening sinus congestion, worsening cough, and fatigue. Patient notes cough is worse at night while laying back, improved during the day. She denies any significant fevers at home, denies any shortness of breath, chest pain.  Past Medical History:  Diagnosis Date  . Hypertension     There are no active problems to display for this patient.   Past Surgical History:  Procedure Laterality Date  . ABDOMINAL HYSTERECTOMY    . CESAREAN SECTION      OB History    Gravida Para Term Preterm AB Living   5 2 2  0 2 0   SAB TAB Ectopic Multiple Live Births   1 1 0 0         Home Medications    Prior to Admission medications   Medication Sig Start Date End Date Taking? Authorizing Provider  furosemide (LASIX) 40 MG tablet Take 40 mg by mouth.   Yes Historical Provider, MD  lisinopril (PRINIVIL,ZESTRIL) 20 MG tablet Take 20 mg by mouth daily.   Yes Historical Provider, MD  benzonatate (TESSALON) 100 MG capsule Take 1 capsule (100 mg total) by mouth every 8 (eight) hours. 02/29/16   Dellis Filbert Ardell Makarewicz, PA-C  fluticasone (FLONASE) 50 MCG/ACT nasal spray Place 2 sprays into both nostrils daily. 02/29/16   Okey Regal, PA-C  metoprolol (LOPRESSOR) 50 MG tablet Take 50 mg by mouth 2 (two) times daily.    Historical Provider, MD  naproxen (NAPROSYN) 500 MG tablet Take 1 tablet (500 mg total) by mouth 2 (two) times daily. 01/02/16    Gloriann Loan, PA-C    Family History No family history on file.  Social History Social History  Substance Use Topics  . Smoking status: Former Research scientist (life sciences)  . Smokeless tobacco: Never Used  . Alcohol use No     Allergies   Patient has no known allergies.   Review of Systems Review of Systems  All other systems reviewed and are negative.    Physical Exam Updated Vital Signs BP 173/92 (BP Location: Right Wrist)   Pulse 63   Temp 98.1 F (36.7 C) (Oral)   Resp 18   Ht 5' 8.5" (1.74 m)   Wt (!) 178.4 kg   LMP 10/25/2011   SpO2 100%   BMI 58.95 kg/m   Physical Exam  Constitutional: She is oriented to person, place, and time. She appears well-developed and well-nourished.  HENT:  Head: Normocephalic and atraumatic.  Nose: Rhinorrhea present.  Mouth/Throat: Uvula is midline and oropharynx is clear and moist. No oropharyngeal exudate, posterior oropharyngeal edema, posterior oropharyngeal erythema or tonsillar abscesses.  Eyes: Conjunctivae are normal. Pupils are equal, round, and reactive to light. Right eye exhibits no discharge. Left eye exhibits no discharge. No scleral icterus.  Neck: Normal range of motion. No JVD present. No tracheal deviation present.  Cardiovascular: Normal rate and regular rhythm.   Murmur heard. Pulmonary/Chest: Effort normal. No  stridor.  Musculoskeletal: Normal range of motion.  Neurological: She is alert and oriented to person, place, and time. Coordination normal.  Psychiatric: She has a normal mood and affect. Her behavior is normal. Judgment and thought content normal.  Nursing note and vitals reviewed.    ED Treatments / Results  Labs (all labs ordered are listed, but only abnormal results are displayed) Labs Reviewed - No data to display  EKG  EKG Interpretation None       Radiology Dg Chest 2 View  Result Date: 02/29/2016 CLINICAL DATA:  Cough, congestion and sinus drainage 3 weeks worse over the past few days. EXAM: CHEST   2 VIEW COMPARISON:  12/08/2014 FINDINGS: Lungs are adequately inflated without consolidation or effusion. There is mild stable cardiomegaly. Mild degenerate change of the spine. IMPRESSION: No active cardiopulmonary disease. Electronically Signed   By: Marin Olp M.D.   On: 02/29/2016 13:34    Procedures Procedures (including critical care time)  Medications Ordered in ED Medications - No data to display   Initial Impression / Assessment and Plan / ED Course  I have reviewed the triage vital signs and the nursing notes.  Pertinent labs & imaging results that were available during my care of the patient were reviewed by me and considered in my medical decision making (see chart for details).  Clinical Course     87 show female presents today with likely viral upper respiratory infection. It appears the patient had a recent infection that was nearly resolved with [address] cough. Again having upper respiratory complaints. She is afebrile nontoxic, she has a normal chest x-ray clear lung sounds and normal oxygenation. No signs of bacterial infection on her exam today. Patient began Flonase, cough medication, symptomatic care instructions. She is instructed follow-up with [facility] wellness for reevaluation and further management. She is given strict return cautioned, she verbalized understanding and agreement to today's plan had no further questions or concerns at the time discharge.  Patient also noted to have a very mild systolic murmur, she is encouraged follow-up with cardiology for reevaluation and assessment of this.  Final Clinical Impressions(s) / ED Diagnoses   Final diagnoses:  Viral URI with cough    New Prescriptions New Prescriptions   BENZONATATE (TESSALON) 100 MG CAPSULE    Take 1 capsule (100 mg total) by mouth every 8 (eight) hours.   FLUTICASONE (FLONASE) 50 MCG/ACT NASAL SPRAY    Place 2 sprays into both nostrils daily.     Okey Regal, PA-C 02/29/16  1437    Veryl Speak, MD 02/29/16 [phone]

## 2016-02-29 NOTE — ED Triage Notes (Signed)
Cough with thick green mucous for several weeks.

## 2016-06-26 ENCOUNTER — Encounter (HOSPITAL_BASED_OUTPATIENT_CLINIC_OR_DEPARTMENT_OTHER): Payer: Self-pay | Admitting: *Deleted

## 2016-06-26 ENCOUNTER — Emergency Department (HOSPITAL_BASED_OUTPATIENT_CLINIC_OR_DEPARTMENT_OTHER): Payer: Self-pay

## 2016-06-26 ENCOUNTER — Emergency Department (HOSPITAL_BASED_OUTPATIENT_CLINIC_OR_DEPARTMENT_OTHER)
Admission: EM | Admit: 2016-06-26 | Discharge: 2016-06-27 | Disposition: A | Discharge status: Home / Self Care | Payer: Self-pay | Attending: Emergency Medicine | Admitting: Emergency Medicine

## 2016-06-26 DIAGNOSIS — I10 Essential (primary) hypertension: Secondary | ICD-10-CM | POA: Insufficient documentation

## 2016-06-26 DIAGNOSIS — Z87891 Personal history of nicotine dependence: Secondary | ICD-10-CM | POA: Insufficient documentation

## 2016-06-26 DIAGNOSIS — Z79899 Other long term (current) drug therapy: Secondary | ICD-10-CM | POA: Insufficient documentation

## 2016-06-26 DIAGNOSIS — M25562 Pain in left knee: Secondary | ICD-10-CM | POA: Insufficient documentation

## 2016-06-26 MED ORDER — NAPROXEN 500 MG PO TABS: 500.0000 mg | ORAL_TABLET | Freq: Two times a day (BID) | ORAL | 0 refills | Status: AC

## 2016-06-26 MED ORDER — HYDROCODONE-ACETAMINOPHEN 5-325 MG PO TABS
2.0000 | ORAL_TABLET | Freq: Once | ORAL | Status: AC
  Administered 2016-06-26: 2 via ORAL
  Filled 2016-06-26: qty 2

## 2016-06-26 NOTE — ED Triage Notes (Signed)
Pt reports chronic problems with L knee. States she was at work today and her knee gave out and she fell. States this isn't worker's comp and no drug/alcohol screen is required. Pt able to ambulate but is painful.

## 2016-06-26 NOTE — Discharge Instructions (Signed)
As discussed, follow-up with orthopedics. In the meantime use naproxen and ice, elevate your leg, rest and wearing a knee brace.  Return to the emergency department if the pain worsens, he had swelling, redness, warmth over the knee, fever, chills or any other new concerning symptoms in the meantime.

## 2016-06-26 NOTE — ED Provider Notes (Signed)
[place] DEPT [facility] Provider Note   CSN: [id number] Arrival date & time: 06/26/16  1811  By signing my name below, I, [name], attest that this documentation has been prepared under the direction and in the presence of non-physician practitioner, Avie Echevaria, PA-C. Electronically Signed: [name], Scribe. 06/26/2016. 9:49 PM.  History   Chief Complaint Chief Complaint  Patient presents with  . Knee Pain   The history is provided by the patient. No language interpreter was used.  HPI Comments: Sandra Knight is a 46 y.o. female who presents to the Emergency Department complaining of constant moderate left knee pain that started about a few weeks ago. She has chronic knee pain and her 06/24/16 [facility] appointment got moved to May 2018. She states her left knee gave out today at work and caused her to fall onto her left knee. No LOC or head injury. She has been taking ibuprofen without any relief. She was able to bear weight but pain is exacerbated by ambulation. She describes the pain as a non-radiating, aching sensation. She reports associated left knee swelling (gradually improving). Denies any fever, chills, site warmth/redness, BLE numbness/tingling, or other complaints at this time.  Past Medical History:  Diagnosis Date  . Hypertension     There are no active problems to display for this patient.   Past Surgical History:  Procedure Laterality Date  . ABDOMINAL HYSTERECTOMY    . CESAREAN SECTION      OB History    Gravida Para Term Preterm AB Living   5 2 2  0 2 0   SAB TAB Ectopic Multiple Live Births   1 1 0 0         Home Medications    Prior to Admission medications   Medication Sig Start Date End Date Taking? Authorizing Provider  furosemide (LASIX) 40 MG tablet Take 40 mg by mouth.   Yes Historical Provider, MD  metoprolol (LOPRESSOR) 100 MG tablet Take 100 mg by mouth 2 (two) times daily.   Yes Historical Provider, MD  benzonatate  (TESSALON) 100 MG capsule Take 1 capsule (100 mg total) by mouth every 8 (eight) hours. 02/29/16   Dellis Filbert Hedges, PA-C  fluticasone (FLONASE) 50 MCG/ACT nasal spray Place 2 sprays into both nostrils daily. 02/29/16   Okey Regal, PA-C  lisinopril (PRINIVIL,ZESTRIL) 20 MG tablet Take 20 mg by mouth daily.    Historical Provider, MD  metoprolol (LOPRESSOR) 50 MG tablet Take 50 mg by mouth 2 (two) times daily.    Historical Provider, MD  naproxen (NAPROSYN) 500 MG tablet Take 1 tablet (500 mg total) by mouth 2 (two) times daily with a meal. 06/26/16   Emeline General, PA-C    Family History No family history on file.  Social History Social History  Substance Use Topics  . Smoking status: Former Research scientist (life sciences)  . Smokeless tobacco: Never Used  . Alcohol use No     Allergies   Patient has no known allergies.   Review of Systems Review of Systems  Constitutional: Negative for chills and fever.  Musculoskeletal: Positive for arthralgias (Left knee), joint swelling (Left knee) and myalgias (Left knee).  Skin: Negative for color change.  Neurological: Negative for syncope and numbness.     Physical Exam Updated Vital Signs BP (!) 191/103   Pulse 66   Temp 98.6 F (37 C) (Oral)   Resp 20   Ht 5\' 8"  (1.727 m)   Wt (!) 380 lb (172.4 kg)   LMP  10/25/2011   SpO2 98%   BMI 57.78 kg/m   Physical Exam  Constitutional: She appears well-developed and well-nourished. No distress.  Patient is afebrile, non-toxic appearing, seating comfortably in chair in no acute distress.  HENT:  Head: Normocephalic.  Eyes: Conjunctivae are normal.  Neck: Neck supple.  Cardiovascular: Normal rate and regular rhythm.   Pulmonary/Chest: Effort normal.  Abdominal: Soft.  Musculoskeletal: Normal range of motion.  Left knee: Negative anterior drawer test. Stable joint. Tenderness with lateral rotation of the tibia. No TTP of patella. Mild swelling. No erythema, warmth. Positive McMurray's test.  Neurovascularly intact in bilateral lower extremities.  Neurological: She is alert.  Skin: Skin is warm and dry.  Psychiatric: She has a normal mood and affect.  Nursing note and vitals reviewed.    ED Treatments / Results  DIAGNOSTIC STUDIES: Oxygen Saturation is 98% on RA, normal by my interpretation.    COORDINATION OF CARE: 9:53 PM- Pt advised of plan for treatment and pt agrees.  Labs (all labs ordered are listed, but only abnormal results are displayed) Labs Reviewed - No data to display  EKG  EKG Interpretation None       Radiology Dg Knee Complete 4 Views Left  Result Date: 06/26/2016 CLINICAL DATA:  Chronic left knee pain, fall EXAM: LEFT KNEE - COMPLETE 4+ VIEW COMPARISON:  None. FINDINGS: No fracture or dislocation is seen. Mild tricompartmental degenerative changes. Small suprapatellar knee joint effusion. The visualized soft tissues are unremarkable. IMPRESSION: No fracture or dislocation is seen. Mild degenerative changes with small suprapatellar knee joint effusion. Electronically Signed   By: Julian Hy M.D.   On: 06/26/2016 20:39    Procedures Procedures (including critical care time)  Medications Ordered in ED Medications  HYDROcodone-acetaminophen (NORCO/VICODIN) 5-325 MG per tablet 2 tablet (2 tablets Oral Given 06/26/16 2214)     Initial Impression / Assessment and Plan / ED Course  I have reviewed the triage vital signs and the nursing notes.  Pertinent labs & imaging results that were available during my care of the patient were reviewed by me and considered in my medical decision making (see chart for details).     Patient X-Ray negative for obvious fracture or dislocation. Pain managed in ED. Pt advised to follow up with orthopedics if symptoms persist. Patient given brace while in ED and crutches, conservative therapy recommended and discussed. Patient will be dc home & is agreeable with above plan.  Discussed strict return precautions  and advised to return to the emergency department if experiencing any new or worsening symptoms. Instructions were understood and patient agreed with discharge plan.  Final Clinical Impressions(s) / ED Diagnoses   Final diagnoses:  Acute pain of left knee    New Prescriptions New Prescriptions   NAPROXEN (NAPROSYN) 500 MG TABLET    Take 1 tablet (500 mg total) by mouth 2 (two) times daily with a meal.   I personally performed the services described in this documentation, which was scribed in my presence. The recorded information has been reviewed and is accurate.     Emeline General, PA-C 06/26/16 [place], MD 06/27/16 [phone]

## 2016-06-26 NOTE — ED Notes (Signed)
Patient transported to X-ray 

## 2016-06-27 NOTE — ED Notes (Signed)
Pt d/c home by Huel Coventry, RN

## 2016-07-15 ENCOUNTER — Inpatient Hospital Stay (HOSPITAL_BASED_OUTPATIENT_CLINIC_OR_DEPARTMENT_OTHER)
Admission: EM | Admit: 2016-07-15 | Discharge: 2016-07-17 | DRG: 194 | Disposition: A | Discharge status: Home / Self Care | Payer: Self-pay | Attending: Internal Medicine | Admitting: Internal Medicine

## 2016-07-15 ENCOUNTER — Encounter (HOSPITAL_BASED_OUTPATIENT_CLINIC_OR_DEPARTMENT_OTHER): Payer: Self-pay | Admitting: Emergency Medicine

## 2016-07-15 ENCOUNTER — Emergency Department (HOSPITAL_BASED_OUTPATIENT_CLINIC_OR_DEPARTMENT_OTHER): Payer: Self-pay

## 2016-07-15 DIAGNOSIS — I10 Essential (primary) hypertension: Secondary | ICD-10-CM | POA: Diagnosis present

## 2016-07-15 DIAGNOSIS — E876 Hypokalemia: Secondary | ICD-10-CM | POA: Diagnosis present

## 2016-07-15 DIAGNOSIS — E662 Morbid (severe) obesity with alveolar hypoventilation: Secondary | ICD-10-CM | POA: Diagnosis present

## 2016-07-15 DIAGNOSIS — F17210 Nicotine dependence, cigarettes, uncomplicated: Secondary | ICD-10-CM | POA: Diagnosis present

## 2016-07-15 DIAGNOSIS — Z79899 Other long term (current) drug therapy: Secondary | ICD-10-CM

## 2016-07-15 DIAGNOSIS — R042 Hemoptysis: Secondary | ICD-10-CM | POA: Diagnosis present

## 2016-07-15 DIAGNOSIS — J189 Pneumonia, unspecified organism: Principal | ICD-10-CM | POA: Diagnosis present

## 2016-07-15 DIAGNOSIS — R000 Tachycardia, unspecified: Secondary | ICD-10-CM | POA: Diagnosis present

## 2016-07-15 DIAGNOSIS — Z6841 Body Mass Index (BMI) 40.0 and over, adult: Secondary | ICD-10-CM

## 2016-07-15 DIAGNOSIS — Z791 Long term (current) use of non-steroidal anti-inflammatories (NSAID): Secondary | ICD-10-CM

## 2016-07-15 DIAGNOSIS — I248 Other forms of acute ischemic heart disease: Secondary | ICD-10-CM | POA: Diagnosis present

## 2016-07-15 HISTORY — DX: Mixed hyperlipidemia: E78.2

## 2016-07-15 HISTORY — DX: Morbid (severe) obesity due to excess calories: E66.01

## 2016-07-15 HISTORY — DX: Intramural leiomyoma of uterus: D25.1

## 2016-07-15 HISTORY — DX: Body Mass Index (BMI) 40.0 and over, adult: Z684

## 2016-07-15 HISTORY — DX: Iron deficiency anemia, unspecified: D50.9

## 2016-07-15 LAB — CBC WITH DIFFERENTIAL/PLATELET
BASOS PCT: 0 %
Basophils Absolute: 0 10*3/uL (ref 0.0–0.1)
EOS ABS: 0 10*3/uL (ref 0.0–0.7)
EOS PCT: 0 %
HCT: 35.9 % — ABNORMAL LOW (ref 36.0–46.0)
HEMOGLOBIN: 11.5 g/dL — AB (ref 12.0–15.0)
LYMPHS ABS: 1.1 10*3/uL (ref 0.7–4.0)
Lymphocytes Relative: 5 %
MCH: 23.5 pg — AB (ref 26.0–34.0)
MCHC: 32 g/dL (ref 30.0–36.0)
MCV: 73.3 fL — ABNORMAL LOW (ref 78.0–100.0)
Monocytes Absolute: 1.7 10*3/uL — ABNORMAL HIGH (ref 0.1–1.0)
Monocytes Relative: 8 %
NEUTROS PCT: 87 %
Neutro Abs: 18.4 10*3/uL — ABNORMAL HIGH (ref 1.7–7.7)
PLATELETS: 288 10*3/uL (ref 150–400)
RBC: 4.9 MIL/uL (ref 3.87–5.11)
RDW: 18 % — ABNORMAL HIGH (ref 11.5–15.5)
WBC: 21.2 10*3/uL — AB (ref 4.0–10.5)

## 2016-07-15 LAB — COMPREHENSIVE METABOLIC PANEL
ALT: 27 U/L (ref 14–54)
ANION GAP: 11 (ref 5–15)
AST: 19 U/L (ref 15–41)
Albumin: 3.4 g/dL — ABNORMAL LOW (ref 3.5–5.0)
Alkaline Phosphatase: 66 U/L (ref 38–126)
BUN: 11 mg/dL (ref 6–20)
CALCIUM: 9.2 mg/dL (ref 8.9–10.3)
CO2: 24 mmol/L (ref 22–32)
Chloride: 101 mmol/L (ref 101–111)
Creatinine, Ser: 0.84 mg/dL (ref 0.44–1.00)
GFR calc Af Amer: >60 mL/min (ref >60)
Glucose, Bld: 145 mg/dL — ABNORMAL HIGH (ref 65–99)
Potassium: 3.4 mmol/L — ABNORMAL LOW (ref 3.5–5.1)
Sodium: 136 mmol/L (ref 135–145)
TOTAL PROTEIN: 7.7 g/dL (ref 6.5–8.1)
Total Bilirubin: 0.9 mg/dL (ref 0.3–1.2)

## 2016-07-15 LAB — URINALYSIS, MICROSCOPIC (REFLEX): WBC UA: NONE SEEN WBC/hpf (ref 0–5)

## 2016-07-15 LAB — URINALYSIS, ROUTINE W REFLEX MICROSCOPIC
Bilirubin Urine: NEGATIVE
Glucose, UA: NEGATIVE mg/dL
Ketones, ur: NEGATIVE mg/dL
Leukocytes, UA: NEGATIVE
NITRITE: NEGATIVE
PROTEIN: 100 mg/dL — AB
SPECIFIC GRAVITY, URINE: 1.02 (ref 1.005–1.030)
pH: 6 (ref 5.0–8.0)

## 2016-07-15 LAB — I-STAT CG4 LACTIC ACID, ED: LACTIC ACID, VENOUS: 1.43 mmol/L (ref 0.5–1.9)

## 2016-07-15 LAB — TROPONIN I: TROPONIN I: 0.03 ng/mL — AB (ref <0.03)

## 2016-07-15 LAB — BRAIN NATRIURETIC PEPTIDE: B NATRIURETIC PEPTIDE 5: 102.1 pg/mL — AB (ref 0.0–100.0)

## 2016-07-15 LAB — LIPASE, BLOOD: Lipase: 18 U/L (ref 11–51)

## 2016-07-15 MED ORDER — IOPAMIDOL (ISOVUE-370) INJECTION 76%
100.0000 mL | Freq: Once | INTRAVENOUS | Status: AC | PRN
  Administered 2016-07-15: 100 mL via INTRAVENOUS

## 2016-07-15 MED ORDER — AZITHROMYCIN 500 MG IV SOLR
INTRAVENOUS | Status: AC
  Administered 2016-07-15: 500 mg via INTRAVASCULAR
  Filled 2016-07-15: qty 500

## 2016-07-15 MED ORDER — SODIUM CHLORIDE 0.9 % IV BOLUS (SEPSIS)
1000.0000 mL | Freq: Once | INTRAVENOUS | Status: AC
  Administered 2016-07-15: 1000 mL via INTRAVENOUS

## 2016-07-15 MED ORDER — ACETAMINOPHEN 325 MG PO TABS
650.0000 mg | ORAL_TABLET | Freq: Once | ORAL | Status: AC
  Administered 2016-07-15: 650 mg via ORAL
  Filled 2016-07-15: qty 2

## 2016-07-15 MED ORDER — DEXTROSE 5 % IV SOLN
1.0000 g | Freq: Once | INTRAVENOUS | Status: AC
  Administered 2016-07-15: 1 g via INTRAVENOUS
  Filled 2016-07-15: qty 10

## 2016-07-15 MED ORDER — DEXTROSE 5 % IV SOLN: 500.0000 mg | Freq: Once | INTRAVENOUS | Status: DC

## 2016-07-15 NOTE — ED Notes (Signed)
Fluid bolus not started per md  Waiting for results on BNP

## 2016-07-15 NOTE — ED Triage Notes (Signed)
Reports productive cough   Sob  X 2 days

## 2016-07-15 NOTE — Plan of Care (Signed)
Accepted to telemetry bed as an inpatient for CAP.  46 year old female with a past medical history of hip retention, morbid obesity who presented to [facility] with a 2 day history of productive cough associated with pleuritic chest pain, dyspnea, fever, chills, night sweats, hemoptysis and urinary frequency.   On initial assessment, the patient was found to have a temperature of 102.70F, was tachypneic and tachycardic. She received 2 L of normal saline bolus, acetaminophen 650 mg by mouth 1, Rocephin and Zithromax IV PB. Normal lactic acid, WBC 21, troponin 0.03 ng/mL, potassium 3.4 mM/ML. Imaging shows left lower lobe consolidation.   Please see below values.  Blood Culture (routine x 2) [[id number]] Collected: 07/15/16 2055  Updated: 07/15/16 2238   Specimen Type: Blood   Specimen Source: Vein   Brain natriuretic peptide [[id number]] (Abnormal) Collected: 07/15/16 2057  Updated: 07/15/16 2145   Specimen Type: Blood   Specimen Source: Vein    B Natriuretic Peptide 102.1 (H) pg/mL  Comprehensive metabolic panel [[id number]] (Abnormal) Collected: 07/15/16 2057  Updated: 07/15/16 2138   Specimen Type: Blood   Specimen Source: Vein    Sodium 136 mmol/L   Potassium 3.4 (L) mmol/L   Chloride 101 mmol/L   CO2 24 mmol/L   Glucose, Bld 145 (H) mg/dL   BUN 11 mg/dL   Creatinine, Ser 0.84 mg/dL   Calcium 9.2 mg/dL   Total Protein 7.7 g/dL   Albumin 3.4 (L) g/dL   AST 19 U/L   ALT 27 U/L   Alkaline Phosphatase 66 U/L   Total Bilirubin 0.9 mg/dL   GFR calc non Af Amer >60 mL/min   GFR calc Af Amer >60 mL/min   Anion gap 11  Lipase, blood [[id number]] Collected: 07/15/16 2057  Updated: 07/15/16 2138   Specimen Type: Blood    Lipase 18 U/L  Troponin I [[id number]] (Abnormal) Collected: 07/15/16 2057  Updated: 07/15/16 2138   Specimen Type: Blood   Specimen Source: Vein    Troponin I 0.03 (HH) ng/mL  Urinalysis, Microscopic (reflex) [[id number]] (Abnormal) Collected: 07/15/16 2046  Updated:  07/15/16 2114    RBC / HPF 0-5 RBC/hpf   WBC, UA NONE SEEN WBC/hpf   Bacteria, UA RARE (A)   Squamous Epithelial / LPF 0-5 (A)  Urinalysis, Routine w reflex microscopic [[id number]] (Abnormal) Collected: 07/15/16 2046  Updated: 07/15/16 2114   Specimen Type: Urine    Color, Urine YELLOW   APPearance CLEAR   Specific Gravity, Urine 1.020   pH 6.0   Glucose, UA NEGATIVE mg/dL   Hgb urine dipstick MODERATE (A)   Bilirubin Urine NEGATIVE   Ketones, ur NEGATIVE mg/dL   Protein, ur 100 (A) mg/dL   Nitrite NEGATIVE   Leukocytes, UA NEGATIVE  I-Stat CG4 Lactic Acid, ED (not at [facility]) [[id number]] Collected: 07/15/16 2111  Updated: 07/15/16 2113   Specimen Type: Blood    Lactic Acid, Venous 1.43 mmol/L  CBC WITH DIFFERENTIAL [[id number]] (Abnormal) Collected: 07/15/16 2057  Updated: 07/15/16 2112   Specimen Type: Blood   Specimen Source: Vein    WBC 21.2 (H) K/uL   RBC 4.90 MIL/uL   Hemoglobin 11.5 (L) g/dL   HCT 35.9 (L) %   MCV 73.3 (L) fL   MCH 23.5 (L) pg   MCHC 32.0 g/dL   RDW 18.0 (H) %   Platelets 288 K/uL   Neutrophils Relative % 87 %   Neutro Abs 18.4 (H) K/uL   Lymphocytes Relative 5 %   Lymphs Abs 1.1 K/uL   Monocytes Relative  8 %   Monocytes Absolute 1.7 (H) K/uL   Eosinophils Relative 0 %   Eosinophils Absolute 0.0 K/uL   Basophils Relative 0 %   Basophils Absolute 0.0 K/uL   Tennis Must, M.D.  [phone].

## 2016-07-15 NOTE — ED Notes (Signed)
Pt given another cup of water

## 2016-07-15 NOTE — ED Notes (Signed)
Paged hospitalist via [place] @ 10:49 pm

## 2016-07-15 NOTE — ED Provider Notes (Signed)
[facility] DEPT [facility] Provider Note   CSN: [id number] Arrival date & time: 07/15/16  1814  By signing my name below, I, Sandra Knight, attest that this documentation has been prepared under the direction and in the presence of [name], MD. Electronically Signed: Hansel Knight, ED Scribe. 07/15/16. 8:21 PM.     History   Chief Complaint Chief Complaint  Patient presents with  . Shortness of Breath    HPI Sandra Knight is a 46 y.o. female with h/o HTN who presents to the Emergency Department complaining of intermittent, productive cough for 2 days. Pt reports associated CP, fever, chills, SOB, night sweats, urinary frequency. Per pt, she also noticed dark red blood in her sputum today. Pt describes her CP as 8/10, dull/achy lower mid chest pain/discomfort with radiation to the back that is worsened with coughing and deep breathing. No h/o similar CP. She reports she has taken Nyquil with temporary relief of fever. No h/o PE/DVT, exogenous estrogen use. Pt is currently on Metoprolol and Lasix. She is a former smoker of 5 years. She denies epistaxis, increased leg swelling from baseline, constipation, diarrhea, nausea, vomiting, dysuria, urgency.   The history is provided by the patient. No language interpreter was used.  Cough  This is a new problem. The current episode started 2 days ago. The problem occurs every few minutes. The problem has been gradually worsening. The cough is productive of blood-tinged sputum. The maximum temperature recorded prior to her arrival was 102 to 102.9 F. The fever has been present for 1 to 2 days. Associated symptoms include chest pain, chills, sweats and shortness of breath. Pertinent negatives include no headaches and no rhinorrhea. Treatments tried: acetaminophen. The treatment provided mild relief. She is not a smoker.    Past Medical History:  Diagnosis Date  . Fibroids, intramural   . Hypertension   . Iron deficiency anemia   . Mixed  hyperlipidemia   . Morbid obesity with BMI of 60.0-69.9, adult ([place])     There are no active problems to display for this patient.   Past Surgical History:  Procedure Laterality Date  . ABDOMINAL HYSTERECTOMY    . CESAREAN SECTION      OB History    Gravida Para Term Preterm AB Living   5 2 2  0 2 0   SAB TAB Ectopic Multiple Live Births   1 1 0 0         Home Medications    Prior to Admission medications   Medication Sig Start Date End Date Taking? Authorizing Provider  benzonatate (TESSALON) 100 MG capsule Take 1 capsule (100 mg total) by mouth every 8 (eight) hours. 02/29/16   Dellis Filbert Hedges, PA-C  fluticasone (FLONASE) 50 MCG/ACT nasal spray Place 2 sprays into both nostrils daily. 02/29/16   Okey Regal, PA-C  furosemide (LASIX) 40 MG tablet Take 40 mg by mouth.    Historical Provider, MD  lisinopril (PRINIVIL,ZESTRIL) 20 MG tablet Take 20 mg by mouth daily.    Historical Provider, MD  metoprolol (LOPRESSOR) 100 MG tablet Take 100 mg by mouth 2 (two) times daily.    Historical Provider, MD  metoprolol (LOPRESSOR) 50 MG tablet Take 50 mg by mouth 2 (two) times daily.    Historical Provider, MD  naproxen (NAPROSYN) 500 MG tablet Take 1 tablet (500 mg total) by mouth 2 (two) times daily with a meal. 06/26/16   Emeline General, PA-C    Family History No family history on file.  Social History Social History  Substance Use Topics  . Smoking status: Former Smoker    Quit date: 07/10/2016  . Smokeless tobacco: Never Used  . Alcohol use No     Allergies   Patient has no known allergies.   Review of Systems Review of Systems  Constitutional: Positive for chills and fever. Negative for activity change, diaphoresis and fatigue.  HENT: Negative for congestion, nosebleeds and rhinorrhea.   Eyes: Negative for visual disturbance.  Respiratory: Positive for cough and shortness of breath. Negative for chest tightness and stridor.   Cardiovascular: Positive for chest  pain. Negative for palpitations and leg swelling.  Gastrointestinal: Negative for abdominal distention, abdominal pain, constipation, diarrhea, nausea and vomiting.  Genitourinary: Positive for frequency. Negative for difficulty urinating, dysuria, flank pain, hematuria, menstrual problem, pelvic pain, vaginal bleeding and vaginal discharge.  Musculoskeletal: Negative for back pain and neck pain.  Skin: Negative for rash and wound.  Neurological: Negative for dizziness, weakness, light-headedness, numbness and headaches.  Psychiatric/Behavioral: Negative for agitation and confusion.  All other systems reviewed and are negative.    Physical Exam Updated Vital Signs BP (!) 157/82   Pulse 92   Temp (!) 100.9 F (38.3 C) (Oral)   Resp (!) 24   Ht 5' 8.5" (1.74 m)   Wt (!) 380 lb (172.4 kg)   LMP 10/25/2011   SpO2 97%   BMI 56.94 kg/m   Physical Exam  Constitutional: She is oriented to person, place, and time. She appears well-developed and well-nourished. No distress.  HENT:  Head: Normocephalic and atraumatic.  Right Ear: External ear normal.  Left Ear: External ear normal.  Nose: Nose normal.  Mouth/Throat: Oropharynx is clear and moist. No oropharyngeal exudate.  Eyes: Conjunctivae and EOM are normal. Pupils are equal, round, and reactive to light.  Neck: Normal range of motion. Neck supple.  Cardiovascular: Normal rate, regular rhythm and normal heart sounds.   Pulmonary/Chest: Effort normal. No stridor. No respiratory distress. She has decreased breath sounds. She has no wheezes. She has no rhonchi. She has no rales.  Diminished breath sounds.   Abdominal: She exhibits no distension. There is no tenderness. There is no rebound.  Musculoskeletal: She exhibits edema.  Generalized and BLE edema.   Neurological: She is alert and oriented to person, place, and time. She has normal reflexes. No sensory deficit. She exhibits normal muscle tone. Coordination normal.  Sensation  intact to light touch of lower extremities. Normal and equal grip strength.   Skin: Skin is warm. No rash noted. She is not diaphoretic. No erythema.  Nursing note and vitals reviewed.    ED Treatments / Results   DIAGNOSTIC STUDIES: Oxygen Saturation is 96% on RA, adequate by my interpretation.    COORDINATION OF CARE: 8:17 PM Discussed treatment plan with pt at bedside which includes CXR and pt agreed to plan.    Labs (all labs ordered are listed, but only abnormal results are displayed) Labs Reviewed  COMPREHENSIVE METABOLIC PANEL - Abnormal; Notable for the following:       Result Value   Potassium 3.4 (*)    Glucose, Bld 145 (*)    Albumin 3.4 (*)    All other components within normal limits  CBC WITH DIFFERENTIAL/PLATELET - Abnormal; Notable for the following:    WBC 21.2 (*)    Hemoglobin 11.5 (*)    HCT 35.9 (*)    MCV 73.3 (*)    MCH 23.5 (*)    RDW 18.0 (*)  Neutro Abs 18.4 (*)    Monocytes Absolute 1.7 (*)    All other components within normal limits  URINALYSIS, ROUTINE W REFLEX MICROSCOPIC - Abnormal; Notable for the following:    Hgb urine dipstick MODERATE (*)    Protein, ur 100 (*)    All other components within normal limits  TROPONIN I - Abnormal; Notable for the following:    Troponin I 0.03 (*)    All other components within normal limits  BRAIN NATRIURETIC PEPTIDE - Abnormal; Notable for the following:    B Natriuretic Peptide 102.1 (*)    All other components within normal limits  URINALYSIS, MICROSCOPIC (REFLEX) - Abnormal; Notable for the following:    Bacteria, UA RARE (*)    Squamous Epithelial / LPF 0-5 (*)    All other components within normal limits  CULTURE, BLOOD (ROUTINE X 2)  CULTURE, BLOOD (ROUTINE X 2)  LIPASE, BLOOD  TROPONIN I  MAGNESIUM  PHOSPHORUS  I-STAT CG4 LACTIC ACID, ED    EKG  EKG Interpretation  Date/Time:  Thursday July 15 2016 20:48:07 EDT Ventricular Rate:  101 PR Interval:    QRS Duration: 78 QT  Interval:  326 QTC Calculation: 423 R Axis:   61 Text Interpretation:  Sinus tachycardia Probable left atrial enlargement Abnormal R-wave progression, late transition Baseline wander in lead(s) II III aVF V2 V3 V4 V5 No prior ECG for comparison. No STEMI Confirmed by Sherry Ruffing MD, [place] [phone]) on 07/15/2016 11:06:06 PM       Radiology Dg Chest 2 View  Result Date: 07/15/2016 CLINICAL DATA:  Cough and congestion for 1 day. EXAM: CHEST  2 VIEW COMPARISON:  02/29/2016 from prior radiographs FINDINGS: Cardiomegaly and peribronchial thickening again noted. There is no evidence of focal airspace disease, pulmonary edema, suspicious pulmonary nodule/mass, pleural effusion, or pneumothorax. No acute bony abnormalities are identified. IMPRESSION: Cardiomegaly without evidence of acute cardiopulmonary disease. Chronic peribronchial thickening. Electronically Signed   By: Margarette Canada M.D.   On: 07/15/2016 18:55   Ct Angio Chest Pe W And/or Wo Contrast  Result Date: 07/15/2016 CLINICAL DATA:  Chest pain shortness of breath and tachycardia EXAM: CT ANGIOGRAPHY CHEST WITH CONTRAST TECHNIQUE: Multidetector CT imaging of the chest was performed using the standard protocol during bolus administration of intravenous contrast. Multiplanar CT image reconstructions and MIPs were obtained to evaluate the vascular anatomy. CONTRAST:  100 mL Isovue 370 intravenous COMPARISON:  Chest x-ray 07/15/2016, CT chest 12/09/2014 FINDINGS: Cardiovascular: Suboptimal opacification of the pulmonary arteries. Examination also limited by body habitus and grainy quality of images. No definite central embolism. Non aneurysmal aorta. Heart size upper normal. No pericardial effusion. Mediastinum/Nodes: Midline trachea. Esophagus grossly unremarkable. No evidence for a thyroid mass. Few prominent lymph nodes in the AP window, measuring up to 7 mm. Lungs/Pleura: Nodular ground-glass density in the left upper lobe, measuring 9 mm, and  consolidation in the left lower lobe with surrounding ground-glass density and air bronchograms. No pleural effusion. Upper Abdomen: No acute abnormality. Musculoskeletal: Flowing osteophytes of the thoracic spine. No acute or suspicious bone lesion. Review of the MIP images confirms the above findings. IMPRESSION: 1. Technically limited study due to habitus and poor contrast opacification of pulmonary arterial system. No gross embolus visualized. 2. Partial consolidation in the left lower lobe with surrounding ground-glass density is suspicious for a pneumonia. Focal nodular ground-glass density in the left upper lobe, suspect also that this is related to infection. Short interval CT follow-up in 6-12 weeks may be considered  following adequate antibiotic therapy. Electronically Signed   By: Donavan Foil M.D.   On: 07/15/2016 22:36    Procedures Procedures (including critical care time)  Medications Ordered in ED Medications  sodium chloride 0.9 % bolus 1,000 mL (not administered)  cefTRIAXone (ROCEPHIN) 1 g in dextrose 5 % 50 mL IVPB (not administered)  azithromycin (ZITHROMAX) 500 mg in dextrose 5 % 250 mL IVPB (not administered)  acetaminophen (TYLENOL) tablet 650 mg (650 mg Oral Given 07/15/16 2001)  iopamidol (ISOVUE-370) 76 % injection 100 mL (100 mLs Intravenous Contrast Given 07/15/16 2150)  sodium chloride 0.9 % bolus 1,000 mL (0 mLs Intravenous Stopped 07/15/16 2254)     Initial Impression / Assessment and Plan / ED Course  I have reviewed the triage vital signs and the nursing notes.  Pertinent labs & imaging results that were available during my care of the patient were reviewed by me and considered in my medical decision making (see chart for details).  Clinical Course as of Jul 15 2298  Thu Jul 15, 2016  2259 Consulted with hospitalist at [facility], who will admit the patient.   [EM]    Clinical Course User Index [EM] Denea Cheaney Haggard is a 46 y.o. female with  past medical history significant for obesity, hypertension, hyperlipidemia, and prior anemia who presents with several days of fever, chills, cough, chest pain, hemoptysis, and shortness of breath. Next  History and exam are seen above.   On exam, patient decreased breath sounds bilaterally. Coarse breath sounds also present. No wheezing. Bilateral lower extremity edema present. On arrival, patient is tachycardic and tachypneic. Patient is not hypotensive. Patient abdomen is nontender and no focal neurologic deficits present.  Based on patient's chief complaint and physical exam, there is clinical concern for pneumonia. Initial chest x-ray shows no pneumonia however, with the hemoptysis, chest pain, shortness of breath with her vital signs, patient will have CT imaging to look for pulmonary embolism for occult pneumonia.  CT imaging confirmed pneumonia and no pulmonary embolism.  EKG showed sinus tachycardia.  Patient's laboratory testing returned showing leukocytosis. Lactic acid was nonelevated. Troponin positive at 0.03. BNP slightly elevated at 102. As there was no significant pulmonary edema present, patient was given several liters of fluids to help with her tachycardia. Heart rate improved with fluids.  Patient given antibiotics for comminuty associated pneumonia. Cultures were obtained.  Given patient's continued chest pain and shortness of breath and her discovery of pneumonia, patient will be admitted for further management. Patient was admitted to [facility] under the care of the Hospitalist  Dr. Olevia Bowens for inpatient management of her pneumonia.    Final Clinical Impressions(s) / ED Diagnoses   Final diagnoses:  Community acquired pneumonia, unspecified laterality    I personally performed the services described in this documentation, which was scribed in my presence. The recorded information has been reviewed and is accurate.  Clinical Impression: 1. Community acquired  pneumonia, unspecified laterality     Disposition: Admit to [facility] service    [name], MD 07/16/16 0010

## 2016-07-15 NOTE — ED Notes (Signed)
Pt given ice water per MD

## 2016-07-15 NOTE — ED Notes (Addendum)
Cough productive 2 days  States blood tinged phlegm increased pain w inspiration and expiration and w cough,  Fever of 102.5 at home

## 2016-07-16 ENCOUNTER — Encounter (HOSPITAL_COMMUNITY): Payer: Self-pay

## 2016-07-16 DIAGNOSIS — A419 Sepsis, unspecified organism: Secondary | ICD-10-CM

## 2016-07-16 DIAGNOSIS — I10 Essential (primary) hypertension: Secondary | ICD-10-CM

## 2016-07-16 DIAGNOSIS — J189 Pneumonia, unspecified organism: Principal | ICD-10-CM | POA: Diagnosis present

## 2016-07-16 LAB — CBC WITH DIFFERENTIAL/PLATELET
Basophils Absolute: 0 10*3/uL (ref 0.0–0.1)
Basophils Relative: 0 %
EOS PCT: 0 %
Eosinophils Absolute: 0 10*3/uL (ref 0.0–0.7)
HEMATOCRIT: 32.9 % — AB (ref 36.0–46.0)
Hemoglobin: 10.4 g/dL — ABNORMAL LOW (ref 12.0–15.0)
LYMPHS ABS: 2.2 10*3/uL (ref 0.7–4.0)
Lymphocytes Relative: 10 %
MCH: 23.5 pg — AB (ref 26.0–34.0)
MCHC: 31.6 g/dL (ref 30.0–36.0)
MCV: 74.3 fL — AB (ref 78.0–100.0)
MONO ABS: 2.1 10*3/uL — AB (ref 0.1–1.0)
MONOS PCT: 9 %
NEUTROS ABS: 18.3 10*3/uL — AB (ref 1.7–7.7)
Neutrophils Relative %: 81 %
PLATELETS: 260 10*3/uL (ref 150–400)
RBC: 4.43 MIL/uL (ref 3.87–5.11)
RDW: 17.6 % — AB (ref 11.5–15.5)
WBC: 22.6 10*3/uL — ABNORMAL HIGH (ref 4.0–10.5)

## 2016-07-16 LAB — COMPREHENSIVE METABOLIC PANEL
ALK PHOS: 66 U/L (ref 38–126)
ALT: 23 U/L (ref 14–54)
ANION GAP: 8 (ref 5–15)
AST: 17 U/L (ref 15–41)
Albumin: 3.1 g/dL — ABNORMAL LOW (ref 3.5–5.0)
BUN: 10 mg/dL (ref 6–20)
CO2: 26 mmol/L (ref 22–32)
Calcium: 8.5 mg/dL — ABNORMAL LOW (ref 8.9–10.3)
Chloride: 104 mmol/L (ref 101–111)
Creatinine, Ser: 0.69 mg/dL (ref 0.44–1.00)
GFR calc non Af Amer: >60 mL/min (ref >60)
GLUCOSE: 126 mg/dL — AB (ref 65–99)
POTASSIUM: 3.5 mmol/L (ref 3.5–5.1)
SODIUM: 138 mmol/L (ref 135–145)
Total Bilirubin: 0.7 mg/dL (ref 0.3–1.2)
Total Protein: 7.1 g/dL (ref 6.5–8.1)

## 2016-07-16 LAB — HIV ANTIBODY (ROUTINE TESTING W REFLEX): HIV Screen 4th Generation wRfx: NONREACTIVE

## 2016-07-16 LAB — INFLUENZA PANEL BY PCR (TYPE A & B)
Influenza A By PCR: NEGATIVE
Influenza B By PCR: NEGATIVE

## 2016-07-16 LAB — TROPONIN I: Troponin I: 0.03 ng/mL (ref <0.03)

## 2016-07-16 LAB — LACTIC ACID, PLASMA: Lactic Acid, Venous: 0.8 mmol/L (ref 0.5–1.9)

## 2016-07-16 LAB — STREP PNEUMONIAE URINARY ANTIGEN: Strep Pneumo Urinary Antigen: NEGATIVE

## 2016-07-16 LAB — PHOSPHORUS: PHOSPHORUS: 2 mg/dL — AB (ref 2.5–4.6)

## 2016-07-16 LAB — MAGNESIUM: MAGNESIUM: 1.7 mg/dL (ref 1.7–2.4)

## 2016-07-16 MED ORDER — CHLORHEXIDINE GLUCONATE 0.12 % MT SOLN
15.0000 mL | Freq: Two times a day (BID) | OROMUCOSAL | Status: DC
  Administered 2016-07-16 – 2016-07-17 (×3): 15 mL via OROMUCOSAL
  Filled 2016-07-16 (×3): qty 15

## 2016-07-16 MED ORDER — HYDRALAZINE HCL 20 MG/ML IJ SOLN: 10.0000 mg | INTRAMUSCULAR | Status: DC | PRN

## 2016-07-16 MED ORDER — DEXTROSE 5 % IV SOLN
2.0000 g | INTRAVENOUS | Status: DC
  Administered 2016-07-16: 2 g via INTRAVENOUS
  Filled 2016-07-16 (×2): qty 2

## 2016-07-16 MED ORDER — ONDANSETRON HCL 4 MG/2ML IJ SOLN: 4.0000 mg | Freq: Four times a day (QID) | INTRAMUSCULAR | Status: DC | PRN

## 2016-07-16 MED ORDER — DEXTROSE 5 % IV SOLN
500.0000 mg | INTRAVENOUS | Status: DC
  Administered 2016-07-16: 500 mg via INTRAVENOUS
  Filled 2016-07-16 (×2): qty 500

## 2016-07-16 MED ORDER — ACETAMINOPHEN 650 MG RE SUPP: 650.0000 mg | Freq: Four times a day (QID) | RECTAL | Status: DC | PRN

## 2016-07-16 MED ORDER — METOPROLOL TARTRATE 50 MG PO TABS
100.0000 mg | ORAL_TABLET | Freq: Two times a day (BID) | ORAL | Status: DC
Start: 2016-07-16 — End: 2016-07-17
  Administered 2016-07-16 – 2016-07-17 (×3): 100 mg via ORAL
  Filled 2016-07-16 (×3): qty 2

## 2016-07-16 MED ORDER — ACETAMINOPHEN 325 MG PO TABS: 650.0000 mg | ORAL_TABLET | Freq: Four times a day (QID) | ORAL | Status: DC | PRN

## 2016-07-16 MED ORDER — ONDANSETRON HCL 4 MG PO TABS: 4.0000 mg | ORAL_TABLET | Freq: Four times a day (QID) | ORAL | Status: DC | PRN

## 2016-07-16 MED ORDER — ORAL CARE MOUTH RINSE
15.0000 mL | Freq: Two times a day (BID) | OROMUCOSAL | Status: DC
  Administered 2016-07-16 (×2): 15 mL via OROMUCOSAL

## 2016-07-16 NOTE — Progress Notes (Addendum)
PHARMACY NOTE -  Azithromycin and ceftriaxone  Pharmacy has been assisting with dosing of azithromycin 500 mg q24h and ceftriaxone 2gm IV q24h  for CAP. Dosage remains stable and need for further dosage adjustment appears unlikely at present.    Will sign off at this time.  Please reconsult if a change in clinical status warrants re-evaluation of dosage.   Dia Sitter, PharmD, BCPS 07/16/2016 8:20 AM

## 2016-07-16 NOTE — H&P (Signed)
History and Physical    Sandra Knight DOB: 1970/10/18 DOA: 07/15/2016  PCP: Pcp Not In System  Patient coming from: Patient was transferred from [facility] [place].  Chief Complaint: Shortness of breath.  HPI: Sandra Knight is a 46 y.o. female with history of hypertension, obesity presents to the ER with complaints of shortness of breath productive cough and subjective feeling of fever and chills. Patient has been having some blood-tinged sputum for last 2 days along with other symptoms. Denies any sick contacts or recent travel. Patient does have history of tobacco abuse quit 5 years ago and had smoked for at least 15 years.   ED Course: In the ER patient was found to be febrile and tachycardic and troponin is mildly elevated. CT angiogram of the chest was negative for PE but showed consolidation concerning for pneumonia. Blood cultures were obtained and patient was started on ceftriaxone and Zithromax for pneumonia and admitted.  Review of Systems: As per HPI, rest all negative.   Past Medical History:  Diagnosis Date  . Fibroids, intramural   . Hypertension   . Iron deficiency anemia   . Mixed hyperlipidemia   . Morbid obesity with BMI of 60.0-69.9, adult [facility])     Past Surgical History:  Procedure Laterality Date  . ABDOMINAL HYSTERECTOMY    . CESAREAN SECTION       reports that she quit smoking 6 days ago. She has never used smokeless tobacco. She reports that she does not drink alcohol or use drugs.  No Known Allergies  Family History  Problem Relation Age of Onset  . Diabetes Mellitus II Mother     Prior to Admission medications   Medication Sig Start Date End Date Taking? Authorizing Provider  furosemide (LASIX) 40 MG tablet Take 40 mg by mouth daily.    Yes Historical Provider, MD  metoprolol (LOPRESSOR) 100 MG tablet Take 100 mg by mouth 2 (two) times daily. 05/12/16 05/12/17 Yes Historical Provider, MD  naproxen (NAPROSYN) 500 MG tablet Take 1 tablet  (500 mg total) by mouth 2 (two) times daily with a meal. 06/26/16  Yes Emeline General, PA-C  benzonatate (TESSALON) 100 MG capsule Take 1 capsule (100 mg total) by mouth every 8 (eight) hours. Patient not taking: Reported on 07/16/2016 02/29/16   Okey Regal, PA-C  fluticasone [facility]) 50 MCG/ACT nasal spray Place 2 sprays into both nostrils daily. Patient not taking: Reported on 07/16/2016 02/29/16   Okey Regal, PA-C    Physical Exam: Vitals:   07/16/16 0100 07/16/16 0130 07/16/16 0200 07/16/16 0332  BP: (!) 157/85 (!) 153/85 138/83 (!) 156/85  Pulse: 89   88  Resp: (!) 22 (!) 23 (!) 21 (!) 22  Temp:    99.4 F (37.4 C)  TempSrc:    Oral  SpO2: 95%   97%  Weight:    (!) 190.1 kg (419 lb 1.6 oz)  Height:    5' 8.5" (1.74 m)      Constitutional: Obese not in distress. Vitals:   07/16/16 0100 07/16/16 0130 07/16/16 0200 07/16/16 0332  BP: (!) 157/85 (!) 153/85 138/83 (!) 156/85  Pulse: 89   88  Resp: (!) 22 (!) 23 (!) 21 (!) 22  Temp:    99.4 F (37.4 C)  TempSrc:    Oral  SpO2: 95%   97%  Weight:    (!) 190.1 kg (419 lb 1.6 oz)  Height:    5' 8.5" (1.74 m)   Eyes:  Anicteric. no pallor. ENMT: No discharge from the ears eyes nose and mouth. Neck: No mass felt. No JVD appreciated. Respiratory: Bilateral air entry present no rhonchi or crepitations. Cardiovascular: S1-S2 no murmurs appreciated. Abdomen: Soft nontender bowel sounds present. Musculoskeletal: No edema. No joint effusion. Skin: No rash. Skin appears warm. Neurologic: Alert awake oriented to time place and person. Moves all extremities. Psychiatric: Appears normal. Normal affect.   Labs on Admission: I have personally reviewed following labs and imaging studies  CBC:  Recent Labs Lab 07/15/16 2057  WBC 21.2*  NEUTROABS 18.4*  HGB 11.5*  HCT 35.9*  MCV 73.3*  PLT 809   Basic Metabolic Panel:  Recent Labs Lab 07/15/16 2057  NA 136  K 3.4*  CL 101  CO2 24  GLUCOSE 145*  BUN 11    CREATININE 0.84  CALCIUM 9.2  MG 1.7  PHOS 2.0*   GFR: Estimated Creatinine Clearance: 152.1 mL/min (by C-G formula based on SCr of 0.84 mg/dL). Liver Function Tests:  Recent Labs Lab 07/15/16 2057  AST 19  ALT 27  ALKPHOS 66  BILITOT 0.9  PROT 7.7  ALBUMIN 3.4*    Recent Labs Lab 07/15/16 2057  LIPASE 18   No results for input(s): AMMONIA in the last 168 hours. Coagulation Profile: No results for input(s): INR, PROTIME in the last 168 hours. Cardiac Enzymes:  Recent Labs Lab 07/15/16 2057 07/16/16 0115  TROPONINI 0.03* 0.03*   BNP (last 3 results) No results for input(s): PROBNP in the last 8760 hours. HbA1C: No results for input(s): HGBA1C in the last 72 hours. CBG: No results for input(s): GLUCAP in the last 168 hours. Lipid Profile: No results for input(s): CHOL, HDL, LDLCALC, TRIG, CHOLHDL, LDLDIRECT in the last 72 hours. Thyroid Function Tests: No results for input(s): TSH, T4TOTAL, FREET4, T3FREE, THYROIDAB in the last 72 hours. Anemia Panel: No results for input(s): VITAMINB12, FOLATE, FERRITIN, TIBC, IRON, RETICCTPCT in the last 72 hours. Urine analysis:    Component Value Date/Time   COLORURINE YELLOW 07/15/2016 2046   APPEARANCEUR CLEAR 07/15/2016 2046   LABSPEC 1.020 07/15/2016 2046   PHURINE 6.0 07/15/2016 2046   GLUCOSEU NEGATIVE 07/15/2016 2046   HGBUR MODERATE (A) 07/15/2016 2046   BILIRUBINUR NEGATIVE 07/15/2016 2046   [place] NEGATIVE 07/15/2016 2046   PROTEINUR 100 (A) 07/15/2016 2046   NITRITE NEGATIVE 07/15/2016 2046   LEUKOCYTESUR NEGATIVE 07/15/2016 2046   Sepsis Labs: @LABRCNTIP (procalcitonin:4,lacticidven:4) )No results found for this or any previous visit (from the past 240 hour(s)).   Radiological Exams on Admission: Dg Chest 2 View  Result Date: 07/15/2016 CLINICAL DATA:  Cough and congestion for 1 day. EXAM: CHEST  2 VIEW COMPARISON:  02/29/2016 from prior radiographs FINDINGS: Cardiomegaly and peribronchial  thickening again noted. There is no evidence of focal airspace disease, pulmonary edema, suspicious pulmonary nodule/mass, pleural effusion, or pneumothorax. No acute bony abnormalities are identified. IMPRESSION: Cardiomegaly without evidence of acute cardiopulmonary disease. Chronic peribronchial thickening. Electronically Signed   By: Margarette Canada M.D.   On: 07/15/2016 18:55   Ct Angio Chest Pe W And/or Wo Contrast  Result Date: 07/15/2016 CLINICAL DATA:  Chest pain shortness of breath and tachycardia EXAM: CT ANGIOGRAPHY CHEST WITH CONTRAST TECHNIQUE: Multidetector CT imaging of the chest was performed using the standard protocol during bolus administration of intravenous contrast. Multiplanar CT image reconstructions and MIPs were obtained to evaluate the vascular anatomy. CONTRAST:  100 mL Isovue 370 intravenous COMPARISON:  Chest x-ray 07/15/2016, CT chest 12/09/2014 FINDINGS: Cardiovascular: Suboptimal  opacification of the pulmonary arteries. Examination also limited by body habitus and grainy quality of images. No definite central embolism. Non aneurysmal aorta. Heart size upper normal. No pericardial effusion. Mediastinum/Nodes: Midline trachea. Esophagus grossly unremarkable. No evidence for a thyroid mass. Few prominent lymph nodes in the AP window, measuring up to 7 mm. Lungs/Pleura: Nodular ground-glass density in the left upper lobe, measuring 9 mm, and consolidation in the left lower lobe with surrounding ground-glass density and air bronchograms. No pleural effusion. Upper Abdomen: No acute abnormality. Musculoskeletal: Flowing osteophytes of the thoracic spine. No acute or suspicious bone lesion. Review of the MIP images confirms the above findings. IMPRESSION: 1. Technically limited study due to habitus and poor contrast opacification of pulmonary arterial system. No gross embolus visualized. 2. Partial consolidation in the left lower lobe with surrounding ground-glass density is suspicious for  a pneumonia. Focal nodular ground-glass density in the left upper lobe, suspect also that this is related to infection. Short interval CT follow-up in 6-12 weeks may be considered following adequate antibiotic therapy. Electronically Signed   By: Donavan Foil M.D.   On: 07/15/2016 22:36    EKG: Independently reviewed. Sinus tachycardia.  Assessment/Plan Active Problems:   Community acquired pneumonia   Essential hypertension   Pneumonia    1. Community-acquired pneumonia with possible early sepsis - patient has been placed on ceftriaxone and Zithromax. Follow blood cultures and sputum cultures. Check urine for Legionella last appendage and an influenza PCR. Check lactate levels. If patient's hemoptysis worsens please consult pulmonologist. Patient will need repeat CAT scan in 6 weeks to ensure resolution given the history of tobacco abuse. 2. History of hypertension - continue metoprolol but will hold off Lasix due to possible developing sepsis. 3. Elevated troponin - has some chest congestion but denies any chest pain. We'll cycle cardiac markers. Will hold off aspirin due to patient having hemoptysis. Check 2-D echo. 4. Morbid obesity.   DVT prophylaxis: SCDs since patient is having hemoptysis. Code Status: Full code.  Family Communication: Discussed with patient.  Disposition Plan: Home.  Consults called: None.  Admission status: Inpatient.    Rise Patience MD [facility] Pager [phone].  If 7PM-7AM, please contact night-coverage [email or web address] Password [facility]  07/16/2016, 4:23 AM

## 2016-07-16 NOTE — Progress Notes (Signed)
Pharmacy Antibiotic Note  Sandra Knight is a 46 y.o. female admitted on 07/15/2016 with pneumonia.  Pharmacy has been consulted for Ceftriaxone/Azithromycin dosing. Presents to the ED with shortness of breath x 2 days. WBC is elevated. CT angio grossly negative for PE, shows possible PNA.   Plan: -Ceftriaxone 2g IV q24h -Azithromycin 500 mg IV q24h -Trend WBC, temp -F/U infectious work-up  Height: 5' 8.5" (174 cm) Weight: (!) 380 lb (172.4 kg) IBW/kg (Calculated) : 65.05  Temp (24hrs), Avg:101.8 F (38.8 C), Min:100.9 F (38.3 C), Max:102.9 F (39.4 C)   Recent Labs Lab 07/15/16 2057 07/15/16 2111  WBC 21.2*  --   CREATININE 0.84  --   LATICACIDVEN  --  1.43    Estimated Creatinine Clearance: 142.7 mL/min (by C-G formula based on SCr of 0.84 mg/dL).    No Known Allergies  Sandra Knight 07/16/2016 1:57 AM

## 2016-07-16 NOTE — Progress Notes (Addendum)
PROGRESS NOTE                                                                                                                                                                                                             Patient Demographics:    Sandra Knight, is a 46 y.o. female, DOB - [birth year], QQV:[id number]  Admit date - 07/15/2016   Admitting Physician Reubin Milan, MD  Outpatient Primary MD for the patient is Pcp Not In System  LOS - 1  Outpatient Specialists:None  Chief Complaint  Patient presents with  . Shortness of Breath       Brief Narrative   Please refer to admission H&P from this morning for details, 46 year old morbidly obese female resented with shortness of breath with productive cough, subjective fever and chills with some blood-tinged sputum for past 2 days. Patient found to be septic with fever, tachycardia significant leukocytosis and mild troponin elevation.. CT angiogram of the chest was negative for PE but showed left lower lobe consolidation.    Subjective:   Patient reports dyspnea on exertion. No further hemoptysis.   Assessment  & Plan :   Principal problem Sepsis associated with left lower lobe pneumonia Empiric Rocephin and azithromycin. Follow blood and sputum culture. Flu PCR negative. Strep pneumonia antigen negative. Check Legionella antigen. If further hemoptysis will need pulmonary evaluation. Supportive care with Tylenol and antitussives.  CT angiogram of the chest negative for PE on admission. Partial consolidation of left lower lobe with surrounding groundglass density seen on CT chest with focal nodular groundglass density in the left upper lobe. Recommend follow-up chest CT in 6-12 weeks to ensure resolution.  Morbid obesity with possible OHS/OSA Patient reports excessive snoring and daytime sleepiness. Will need outpatient sleep study. Counseled on weight loss and  exercise.  Essential hypertension Continue metoprolol. Holding Lasix.  Elevated troponin.  Possibly demand ischemia. Cycle troponin. Holding aspirin due to hemoptysis. Order 2-D echo.  Hypokalemia Replenished    Code Status : Full code  Family Communication  : None at bedside  Disposition Plan  : Home in the next 24-48 hours if improved  Barriers For Discharge : Active symptoms  Consults  : None  Procedures  :  CT angio chest 2-D echo  DVT Prophylaxis  :  Lovenox -   Lab Results  Component Value  Date   PLT 260 07/16/2016    Antibiotics  :    Anti-infectives    Start     Dose/Rate Route Frequency Ordered Stop   07/16/16 1830  azithromycin (ZITHROMAX) 500 mg in dextrose 5 % 250 mL IVPB     500 mg 250 mL/hr over 60 Minutes Intravenous Every 24 hours 07/16/16 0200     07/16/16 1800  cefTRIAXone (ROCEPHIN) 2 g in dextrose 5 % 50 mL IVPB     2 g 100 mL/hr over 30 Minutes Intravenous Every 24 hours 07/16/16 0200     07/15/16 2334  azithromycin (ZITHROMAX) 500 MG injection    Comments:  Anner Crete   : cabinet override      07/15/16 2334 07/15/16 2343   07/15/16 2245  cefTRIAXone (ROCEPHIN) 1 g in dextrose 5 % 50 mL IVPB     1 g 100 mL/hr over 30 Minutes Intravenous  Once 07/15/16 2243 07/15/16 2335   07/15/16 2245  azithromycin (ZITHROMAX) 500 mg in dextrose 5 % 250 mL IVPB  Status:  Discontinued     500 mg 250 mL/hr over 60 Minutes Intravenous  Once 07/15/16 2243 07/16/16 0422        Objective:   Vitals:   07/16/16 0100 07/16/16 0130 07/16/16 0200 07/16/16 0332  BP: (!) 157/85 (!) 153/85 138/83 (!) 156/85  Pulse: 89   88  Resp: (!) 22 (!) 23 (!) 21 (!) 22  Temp:    99.4 F (37.4 C)  TempSrc:    Oral  SpO2: 95%   97%  Weight:    (!) 190.1 kg (419 lb 1.6 oz)  Height:    5' 8.5" (1.74 m)    Wt Readings from Last 3 Encounters:  07/16/16 (!) 190.1 kg (419 lb 1.6 oz)  06/26/16 (!) 172.4 kg (380 lb)  02/29/16 (!) 178.4 kg (393 lb 6.4 oz)      Intake/Output Summary (Last 24 hours) at 07/16/16 1109 Last data filed at 07/16/16 0830  Gross per 24 hour  Intake              720 ml  Output                0 ml  Net              720 ml     Physical Exam  Gen: not in distress HEENT:  moist mucosa, supple neck Chest: Diminished breath sounds due to body habitus CVS: N S1&S2, no murmurs, rubs or gallop GI: soft, NT, ND Musculoskeletal: warm, no edema     Data Review:    CBC  Recent Labs Lab 07/15/16 2057 07/16/16 0556  WBC 21.2* 22.6*  HGB 11.5* 10.4*  HCT 35.9* 32.9*  PLT 288 260  MCV 73.3* 74.3*  MCH 23.5* 23.5*  MCHC 32.0 31.6  RDW 18.0* 17.6*  LYMPHSABS 1.1 2.2  MONOABS 1.7* 2.1*  EOSABS 0.0 0.0  BASOSABS 0.0 0.0    Chemistries   Recent Labs Lab 07/15/16 2057 07/16/16 0556  NA 136 138  K 3.4* 3.5  CL 101 104  CO2 24 26  GLUCOSE 145* 126*  BUN 11 10  CREATININE 0.84 0.69  CALCIUM 9.2 8.5*  MG 1.7  --   AST 19 17  ALT 27 23  ALKPHOS 66 66  BILITOT 0.9 0.7   ------------------------------------------------------------------------------------------------------------------ No results for input(s): CHOL, HDL, LDLCALC, TRIG, CHOLHDL, LDLDIRECT in the last 72 hours.  No results found for: HGBA1C ------------------------------------------------------------------------------------------------------------------  No results for input(s): TSH, T4TOTAL, T3FREE, THYROIDAB in the last 72 hours.  Invalid input(s): FREET3 ------------------------------------------------------------------------------------------------------------------ No results for input(s): VITAMINB12, FOLATE, FERRITIN, TIBC, IRON, RETICCTPCT in the last 72 hours.  Coagulation profile No results for input(s): INR, PROTIME in the last 168 hours.  No results for input(s): DDIMER in the last 72 hours.  Cardiac Enzymes  Recent Labs Lab 07/15/16 2057 07/16/16 0115 07/16/16 0720  TROPONINI 0.03* 0.03* <0.03    ------------------------------------------------------------------------------------------------------------------    Component Value Date/Time   BNP 102.1 (H) 07/15/2016 2057    Inpatient Medications  Scheduled Meds: . chlorhexidine  15 mL Mouth Rinse BID  . mouth rinse  15 mL Mouth Rinse q12n4p  . metoprolol  100 mg Oral BID   Continuous Infusions: . azithromycin    . cefTRIAXone (ROCEPHIN)  IV     PRN Meds:.acetaminophen **OR** acetaminophen, hydrALAZINE, ondansetron **OR** ondansetron (ZOFRAN) IV  Micro Results No results found for this or any previous visit (from the past 240 hour(s)).  Radiology Reports Dg Chest 2 View  Result Date: 07/15/2016 CLINICAL DATA:  Cough and congestion for 1 day. EXAM: CHEST  2 VIEW COMPARISON:  02/29/2016 from prior radiographs FINDINGS: Cardiomegaly and peribronchial thickening again noted. There is no evidence of focal airspace disease, pulmonary edema, suspicious pulmonary nodule/mass, pleural effusion, or pneumothorax. No acute bony abnormalities are identified. IMPRESSION: Cardiomegaly without evidence of acute cardiopulmonary disease. Chronic peribronchial thickening. Electronically Signed   By: Margarette Canada M.D.   On: 07/15/2016 18:55   Ct Angio Chest Pe W And/or Wo Contrast  Result Date: 07/15/2016 CLINICAL DATA:  Chest pain shortness of breath and tachycardia EXAM: CT ANGIOGRAPHY CHEST WITH CONTRAST TECHNIQUE: Multidetector CT imaging of the chest was performed using the standard protocol during bolus administration of intravenous contrast. Multiplanar CT image reconstructions and MIPs were obtained to evaluate the vascular anatomy. CONTRAST:  100 mL Isovue 370 intravenous COMPARISON:  Chest x-ray 07/15/2016, CT chest 12/09/2014 FINDINGS: Cardiovascular: Suboptimal opacification of the pulmonary arteries. Examination also limited by body habitus and grainy quality of images. No definite central embolism. Non aneurysmal aorta. Heart size  upper normal. No pericardial effusion. Mediastinum/Nodes: Midline trachea. Esophagus grossly unremarkable. No evidence for a thyroid mass. Few prominent lymph nodes in the AP window, measuring up to 7 mm. Lungs/Pleura: Nodular ground-glass density in the left upper lobe, measuring 9 mm, and consolidation in the left lower lobe with surrounding ground-glass density and air bronchograms. No pleural effusion. Upper Abdomen: No acute abnormality. Musculoskeletal: Flowing osteophytes of the thoracic spine. No acute or suspicious bone lesion. Review of the MIP images confirms the above findings. IMPRESSION: 1. Technically limited study due to habitus and poor contrast opacification of pulmonary arterial system. No gross embolus visualized. 2. Partial consolidation in the left lower lobe with surrounding ground-glass density is suspicious for a pneumonia. Focal nodular ground-glass density in the left upper lobe, suspect also that this is related to infection. Short interval CT follow-up in 6-12 weeks may be considered following adequate antibiotic therapy. Electronically Signed   By: Donavan Foil M.D.   On: 07/15/2016 22:36   Dg Knee Complete 4 Views Left  Result Date: 06/26/2016 CLINICAL DATA:  Chronic left knee pain, fall EXAM: LEFT KNEE - COMPLETE 4+ VIEW COMPARISON:  None. FINDINGS: No fracture or dislocation is seen. Mild tricompartmental degenerative changes. Small suprapatellar knee joint effusion. The visualized soft tissues are unremarkable. IMPRESSION: No fracture or dislocation is seen. Mild degenerative changes with small suprapatellar knee joint effusion.  Electronically Signed   By: Julian Hy M.D.   On: 06/26/2016 20:39    Time Spent in minutes  20   Louellen Molder M.D on 07/16/2016 at 11:09 AM  Between 7am to 7pm - Pager - [phone]  After 7pm go to [email or web address] - password [facility]  [facility] -  Office  [phone]

## 2016-07-17 ENCOUNTER — Inpatient Hospital Stay (HOSPITAL_COMMUNITY): Payer: Self-pay

## 2016-07-17 LAB — CBC
HCT: 33.8 % — ABNORMAL LOW (ref 36.0–46.0)
Hemoglobin: 10.6 g/dL — ABNORMAL LOW (ref 12.0–15.0)
MCH: 23.5 pg — AB (ref 26.0–34.0)
MCHC: 31.4 g/dL (ref 30.0–36.0)
MCV: 74.9 fL — AB (ref 78.0–100.0)
PLATELETS: 290 10*3/uL (ref 150–400)
RBC: 4.51 MIL/uL (ref 3.87–5.11)
RDW: 17.9 % — ABNORMAL HIGH (ref 11.5–15.5)
WBC: 13.5 10*3/uL — AB (ref 4.0–10.5)

## 2016-07-17 LAB — EXPECTORATED SPUTUM ASSESSMENT W REFEX TO RESP CULTURE

## 2016-07-17 LAB — EXPECTORATED SPUTUM ASSESSMENT W GRAM STAIN, RFLX TO RESP C

## 2016-07-17 MED ORDER — LEVOFLOXACIN 500 MG PO TABS: 500.0000 mg | ORAL_TABLET | Freq: Every day | ORAL | 0 refills | Status: AC

## 2016-07-17 NOTE — Progress Notes (Signed)
Pt discharged to home, pt stable, instructions reviewed acknowledge understanding. SRP, RN

## 2016-07-17 NOTE — Discharge Summary (Addendum)
Physician Discharge Summary  Elnora Quizon ZOX:[id number] DOB: January 24, 1971 DOA: 07/15/2016  PCP: Pcp Not In System  Admit date: 07/15/2016 Discharge date: 07/17/2016  Admitted From: home Disposition:  Home  Recommendations for Outpatient Follow-up:  1. Follow up with PCP in 1-2 weeks 2. Chest x-ray in 6 weeks.  Home Health:no Equipment/Devices:none  Discharge Condition:stable CODE STATUS:full Diet recommendation: Heart Healthy  Brief/Interim Summary: 46 year old female with past medical history of essential hypertension obesity comes to the ED complaining of shortness of breath and productive cough.  Discharge Diagnoses:  Active Problems:   Community acquired pneumonia   Essential hypertension   Pneumonia  Community acquired pneumonia Continue IV Rocephin and azithromycin he has defervesced. His leukocytosis is improving. CT angiography chest negative for PE. Sepsis was rule out.  Essential hypertension: Resume Lasix as an outpatient.  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  Elevated cardiac biomarkers: Possibly demand ischemia.   Discharge Instructions  Discharge Instructions    Diet - low sodium heart healthy    Complete by:  As directed    Increase activity slowly    Complete by:  As directed      Allergies as of 07/17/2016   No Known Allergies     Medication List    TAKE these medications   benzonatate 100 MG capsule Commonly known as:  TESSALON Take 1 capsule (100 mg total) by mouth every 8 (eight) hours.   fluticasone 50 MCG/ACT nasal spray Commonly known as:  FLONASE Place 2 sprays into both nostrils daily.   furosemide 40 MG tablet Commonly known as:  LASIX Take 40 mg by mouth daily.   levofloxacin 500 MG tablet Commonly known as:  LEVAQUIN Take 1 tablet (500 mg total) by mouth daily.   metoprolol 100 MG tablet Commonly known as:  LOPRESSOR Take 100 mg  by mouth 2 (two) times daily.   naproxen 500 MG tablet Commonly known as:  NAPROSYN Take 1 tablet (500 mg total) by mouth 2 (two) times daily with a meal.       No Known Allergies  Consultations:  None   Procedures/Studies: Dg Chest 2 View  Result Date: 07/15/2016 CLINICAL DATA:  Cough and congestion for 1 day. EXAM: CHEST  2 VIEW COMPARISON:  02/29/2016 from prior radiographs FINDINGS: Cardiomegaly and peribronchial thickening again noted. There is no evidence of focal airspace disease, pulmonary edema, suspicious pulmonary nodule/mass, pleural effusion, or pneumothorax. No acute bony abnormalities are identified. IMPRESSION: Cardiomegaly without evidence of acute cardiopulmonary disease. Chronic peribronchial thickening. Electronically Signed   By: Margarette Canada M.D.   On: 07/15/2016 18:55   Ct Angio Chest Pe W And/or Wo Contrast  Result Date: 07/15/2016 CLINICAL DATA:  Chest pain shortness of breath and tachycardia EXAM: CT ANGIOGRAPHY CHEST WITH CONTRAST TECHNIQUE: Multidetector CT imaging of the chest was performed using the standard protocol during bolus administration of intravenous contrast. Multiplanar CT image reconstructions and MIPs were obtained to evaluate the vascular anatomy. CONTRAST:  100 mL Isovue 370 intravenous COMPARISON:  Chest x-ray 07/15/2016, CT chest 12/09/2014 FINDINGS: Cardiovascular: Suboptimal opacification of the pulmonary arteries. Examination also limited by body habitus and grainy quality of images. No definite central embolism. Non aneurysmal aorta. Heart size upper normal. No pericardial effusion. Mediastinum/Nodes: Midline trachea. Esophagus grossly unremarkable. No evidence for a thyroid mass. Few prominent lymph nodes in the AP window, measuring up to 7 mm. Lungs/Pleura: Nodular ground-glass density in the left upper  lobe, measuring 9 mm, and consolidation in the left lower lobe with surrounding ground-glass density and air bronchograms. No pleural  effusion. Upper Abdomen: No acute abnormality. Musculoskeletal: Flowing osteophytes of the thoracic spine. No acute or suspicious bone lesion. Review of the MIP images confirms the above findings. IMPRESSION: 1. Technically limited study due to habitus and poor contrast opacification of pulmonary arterial system. No gross embolus visualized. 2. Partial consolidation in the left lower lobe with surrounding ground-glass density is suspicious for a pneumonia. Focal nodular ground-glass density in the left upper lobe, suspect also that this is related to infection. Short interval CT follow-up in 6-12 weeks may be considered following adequate antibiotic therapy. Electronically Signed   By: Donavan Foil M.D.   On: 07/15/2016 22:36   Dg Knee Complete 4 Views Left  Result Date: 06/26/2016 CLINICAL DATA:  Chronic left knee pain, fall EXAM: LEFT KNEE - COMPLETE 4+ VIEW COMPARISON:  None. FINDINGS: No fracture or dislocation is seen. Mild tricompartmental degenerative changes. Small suprapatellar knee joint effusion. The visualized soft tissues are unremarkable. IMPRESSION: No fracture or dislocation is seen. Mild degenerative changes with small suprapatellar knee joint effusion. Electronically Signed   By: Julian Hy M.D.   On: 06/26/2016 20:39     Subjective: She relates reveals create her cough is improved.  Discharge Exam: Vitals:   07/17/16 0529 07/17/16 1027  BP: (!) 159/94 (!) 163/92  Pulse: 71 70  Resp: 18 18  Temp: 98.5 F (36.9 C)    Vitals:   07/16/16 1428 07/16/16 2042 07/17/16 0529 07/17/16 1027  BP: (!) 174/92 (!) 170/86 (!) 159/94 (!) 163/92  Pulse: 69 78 71 70  Resp: (!) 21 19 18 18   Temp: 98.9 F (37.2 C) 99.3 F (37.4 C) 98.5 F (36.9 C)   TempSrc: Oral Oral Oral   SpO2: 98% 100% 100%   Weight:      Height:        General: Patient is awake alert and oriented 3 Cardiovascular: She has a positive S1-S2 Respiratory: Good air movement and clear to  auscultation. Abdominal: Positive bowel sounds soft nontender nondistended. Extremities: no edema, no cyanosis    The results of significant diagnostics from this hospitalization (including imaging, microbiology, ancillary and laboratory) are listed below for reference.     Microbiology: Recent Results (from the past 240 hour(s))  Blood Culture (routine x 2)     Status: None (Preliminary result)   Collection Time: 07/15/16  8:55 PM  Result Value Ref Range Status   Specimen Description BLOOD RIGHT ANTECUBITAL  Final   Special Requests   Final    BOTTLES DRAWN AEROBIC AND ANAEROBIC Blood Culture adequate volume   Culture   Final    NO GROWTH < 12 HOURS Performed at [facility], [address] [place], [place] [address]    Report Status PENDING  Incomplete  Blood Culture (routine x 2)     Status: None (Preliminary result)   Collection Time: 07/15/16  8:55 PM  Result Value Ref Range Status   Specimen Description BLOOD LEFT ANTECUBITAL  Final   Special Requests   Final    BOTTLES DRAWN AEROBIC AND ANAEROBIC Blood Culture adequate volume   Culture   Final    NO GROWTH < 12 HOURS Performed at [facility], [place] [address] [place], [place] [address]    Report Status PENDING  Incomplete  Culture, sputum-assessment     Status: None   Collection Time: 07/17/16  7:22 AM  Result Value Ref Range Status   Specimen Description SPUTUM  Final   Special Requests NONE  Final   Sputum evaluation THIS SPECIMEN IS ACCEPTABLE FOR SPUTUM CULTURE  Final   Report Status 07/17/2016 FINAL  Final     Labs: BNP (last 3 results)  Recent Labs  07/15/16 2057  BNP 505.3*   Basic Metabolic Panel:  Recent Labs Lab 07/15/16 2057 07/16/16 0556  NA 136 138  K 3.4* 3.5  CL 101 104  CO2 24 26  GLUCOSE 145* 126*  BUN 11 10  CREATININE 0.84 0.69  CALCIUM 9.2 8.5*  MG 1.7  --   PHOS 2.0*  --    Liver Function Tests:  Recent Labs Lab 07/15/16 2057 07/16/16 0556  AST 19 17   ALT 27 23  ALKPHOS 66 66  BILITOT 0.9 0.7  PROT 7.7 7.1  ALBUMIN 3.4* 3.1*    Recent Labs Lab 07/15/16 2057  LIPASE 18   No results for input(s): AMMONIA in the last 168 hours. CBC:  Recent Labs Lab 07/15/16 2057 07/16/16 0556 07/17/16 0545  WBC 21.2* 22.6* 13.5*  NEUTROABS 18.4* 18.3*  --   HGB 11.5* 10.4* 10.6*  HCT 35.9* 32.9* 33.8*  MCV 73.3* 74.3* 74.9*  PLT 288 260 290   Cardiac Enzymes:  Recent Labs Lab 07/15/16 2057 07/16/16 0115 07/16/16 0720 07/16/16 1308 07/16/16 1933  TROPONINI 0.03* 0.03* <0.03 <0.03 <0.03   BNP: Invalid input(s): POCBNP CBG: No results for input(s): GLUCAP in the last 168 hours. D-Dimer No results for input(s): DDIMER in the last 72 hours. Hgb A1c No results for input(s): HGBA1C in the last 72 hours. Lipid Profile No results for input(s): CHOL, HDL, LDLCALC, TRIG, CHOLHDL, LDLDIRECT in the last 72 hours. Thyroid function studies No results for input(s): TSH, T4TOTAL, T3FREE, THYROIDAB in the last 72 hours.  Invalid input(s): FREET3 Anemia work up No results for input(s): VITAMINB12, FOLATE, FERRITIN, TIBC, IRON, RETICCTPCT in the last 72 hours. Urinalysis    Component Value Date/Time   COLORURINE YELLOW 07/15/2016 2046   APPEARANCEUR CLEAR 07/15/2016 2046   LABSPEC 1.020 07/15/2016 2046   PHURINE 6.0 07/15/2016 2046   GLUCOSEU NEGATIVE 07/15/2016 2046   HGBUR MODERATE (A) 07/15/2016 2046   BILIRUBINUR NEGATIVE 07/15/2016 2046   KETONESUR NEGATIVE 07/15/2016 2046   PROTEINUR 100 (A) 07/15/2016 2046   NITRITE NEGATIVE 07/15/2016 2046   LEUKOCYTESUR NEGATIVE 07/15/2016 2046   Sepsis Labs Invalid input(s): PROCALCITONIN,  WBC,  LACTICIDVEN Microbiology Recent Results (from the past 240 hour(s))  Blood Culture (routine x 2)     Status: None (Preliminary result)   Collection Time: 07/15/16  8:55 PM  Result Value Ref Range Status   Specimen Description BLOOD RIGHT ANTECUBITAL  Final   Special Requests   Final     BOTTLES DRAWN AEROBIC AND ANAEROBIC Blood Culture adequate volume   Culture   Final    NO GROWTH < 12 HOURS Performed at [facility], [place] [address], [place],  [address]    Report Status PENDING  Incomplete  Blood Culture (routine x 2)     Status: None (Preliminary result)   Collection Time: 07/15/16  8:55 PM  Result Value Ref Range Status   Specimen Description BLOOD LEFT ANTECUBITAL  Final   Special Requests   Final    BOTTLES DRAWN AEROBIC AND ANAEROBIC Blood Culture adequate volume   Culture   Final    NO GROWTH < 12 HOURS Performed at  [facility], [place] [address], [place], [place] [address]    Report Status PENDING  Incomplete  Culture, sputum-assessment     Status: None   Collection Time: 07/17/16  7:22 AM  Result Value Ref Range Status   Specimen Description SPUTUM  Final   Special Requests NONE  Final   Sputum evaluation THIS SPECIMEN IS ACCEPTABLE FOR SPUTUM CULTURE  Final   Report Status 07/17/2016 FINAL  Final     Time coordinating discharge: Over 30 minutes  SIGNED:   Charlynne Cousins, MD  [facility] 07/17/2016, 11:00 AM Pager   If 7PM-7AM, please contact night-coverage [email or web address] Password [email or web address]

## 2016-07-19 LAB — LEGIONELLA PNEUMOPHILA SEROGP 1 UR AG: L. pneumophila Serogp 1 Ur Ag: NEGATIVE

## 2016-07-20 LAB — CULTURE, RESPIRATORY W GRAM STAIN: Culture: NORMAL

## 2016-07-20 LAB — CULTURE, BLOOD (ROUTINE X 2)
CULTURE: NO GROWTH
CULTURE: NO GROWTH
Special Requests: ADEQUATE
Special Requests: ADEQUATE

## 2016-07-20 LAB — CULTURE, RESPIRATORY
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
## 2018-12-05 IMAGING — DX DG CHEST 2V
2 series · 2 of 2 positions shown · non-contrast
Comparison: 12/08/2014

CLINICAL DATA: Cough, congestion and sinus drainage 3 weeks worse
over the past few days.

EXAM:
CHEST  2 VIEW

[chest pa]
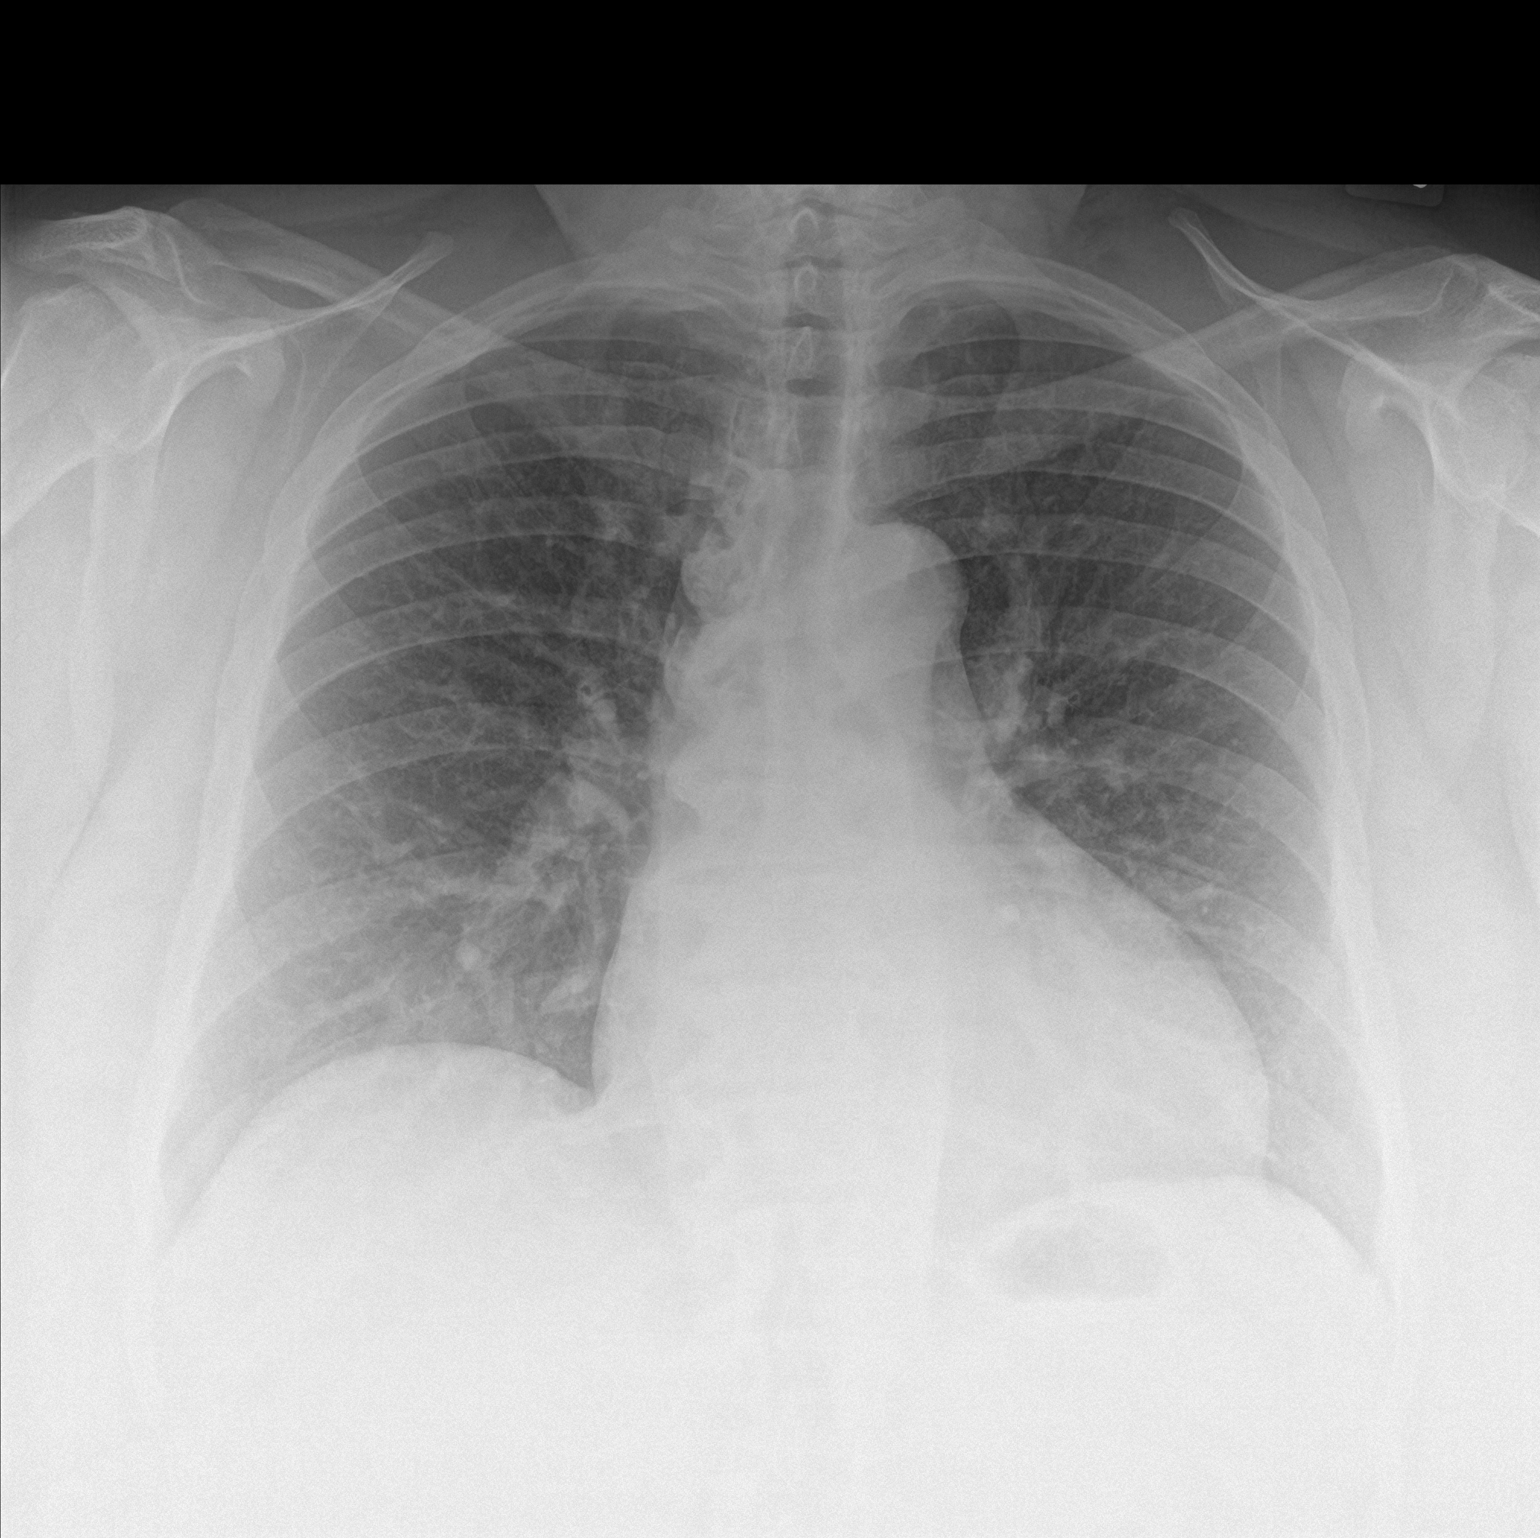

[chest lat]
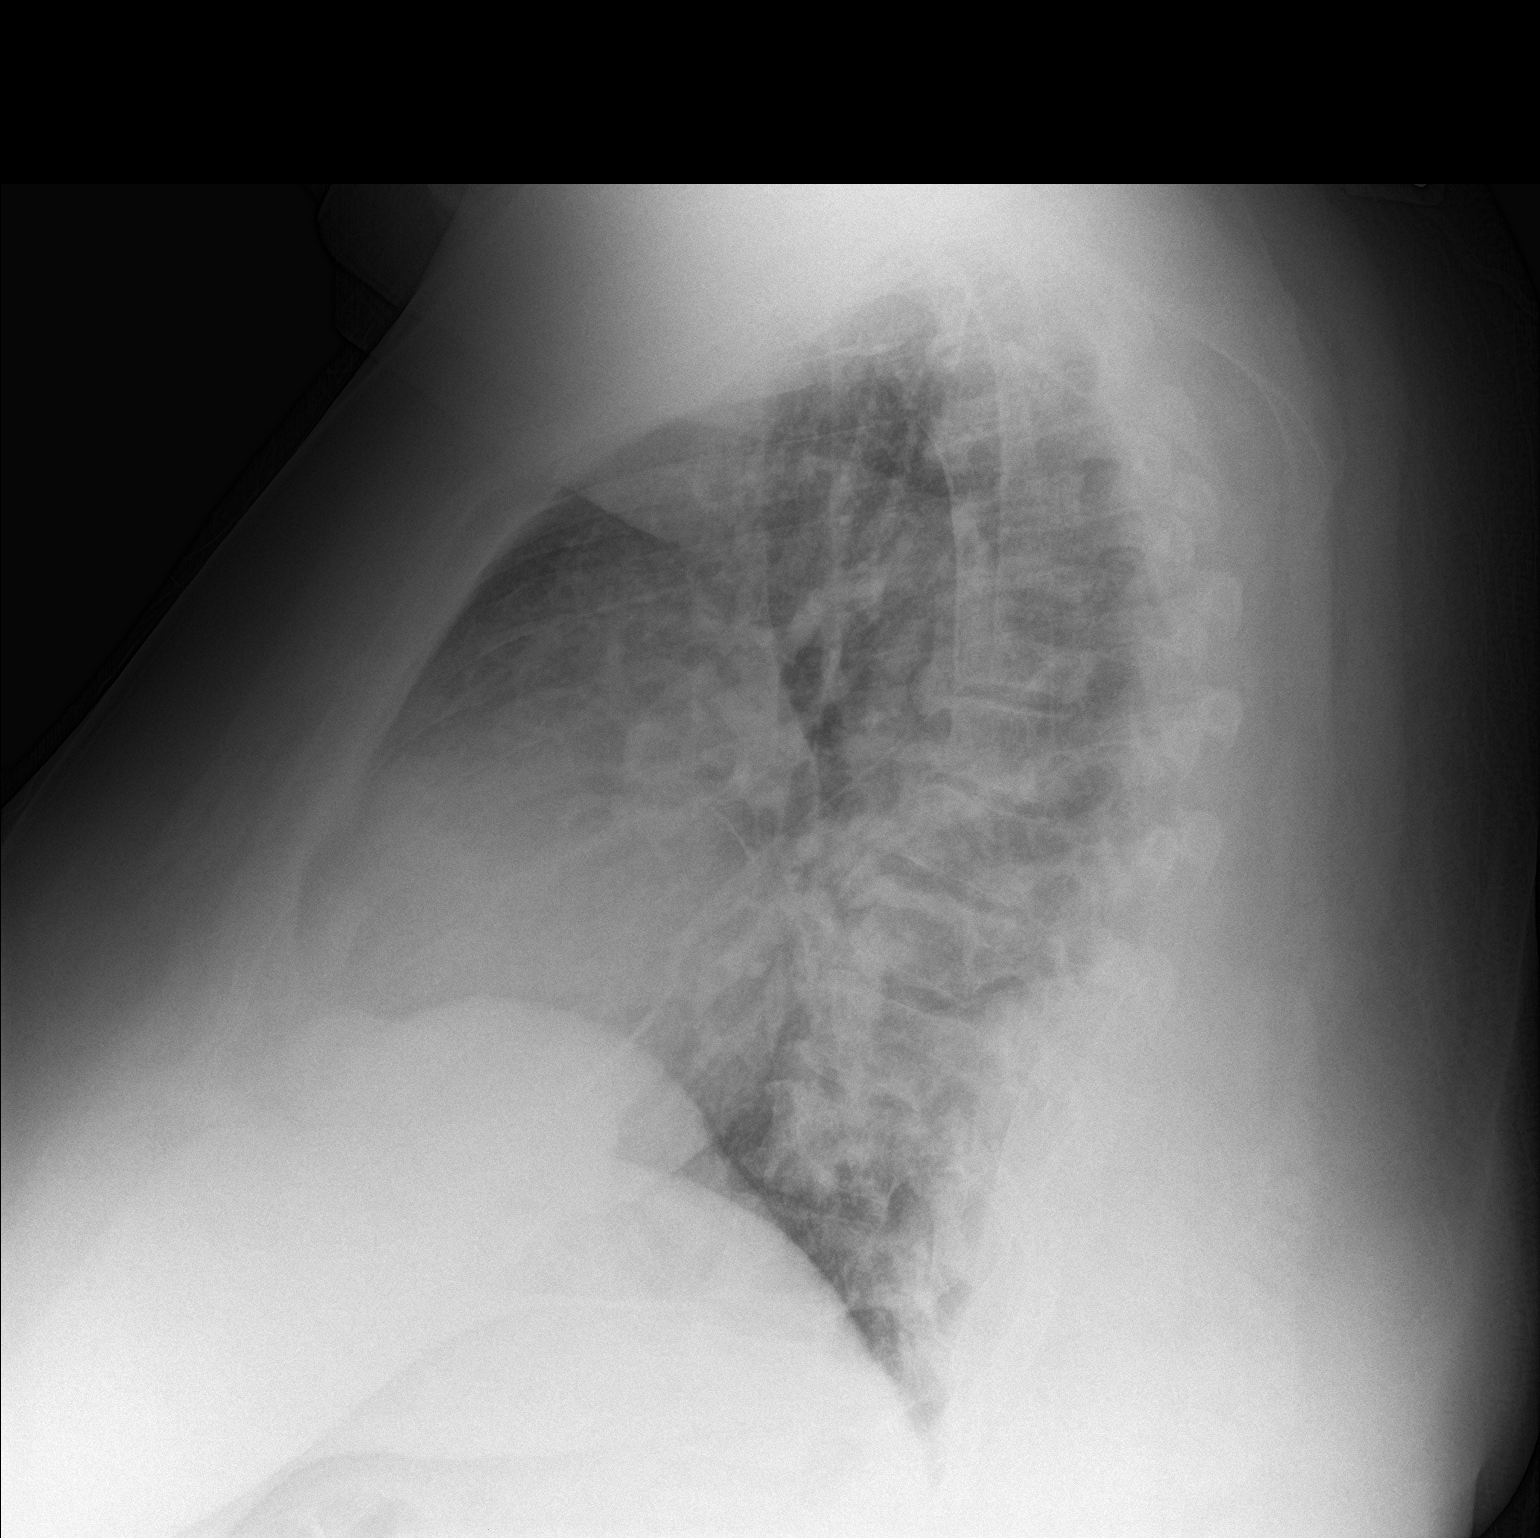

[2 of 2 positions shown; findings below may reference images not displayed]

FINDINGS: Lungs are adequately inflated without consolidation or effusion.
There is mild stable cardiomegaly. Mild degenerate change of the
spine.
IMPRESSION: No active cardiopulmonary disease.

## 2019-04-02 IMAGING — DX DG KNEE COMPLETE 4+V*L*
4 series · 4 of 4 positions shown · non-contrast
Comparison: None.

CLINICAL DATA: Chronic left knee pain, fall

EXAM:
LEFT KNEE - COMPLETE 4+ VIEW

[knee ap]
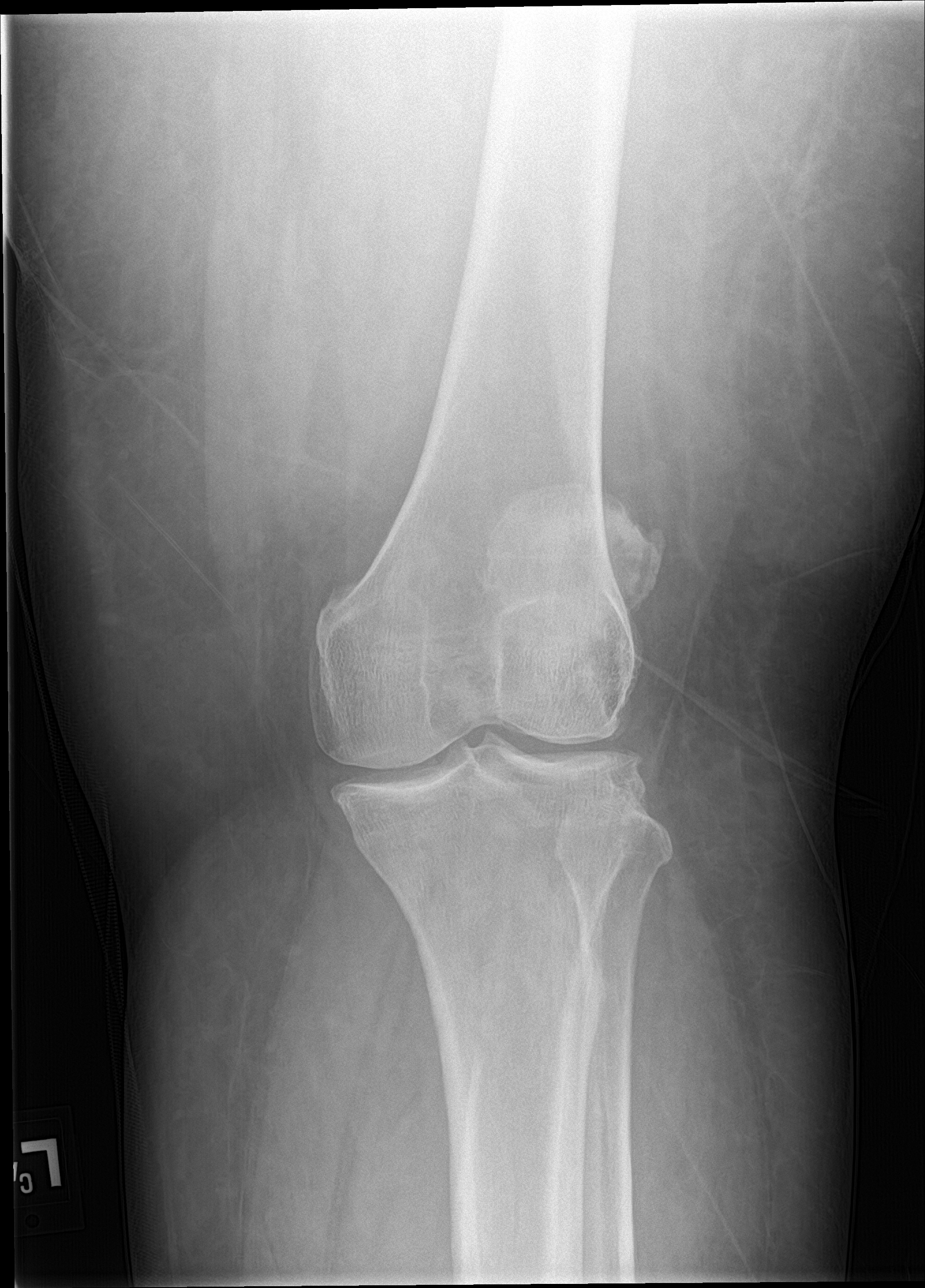

[knee lat]
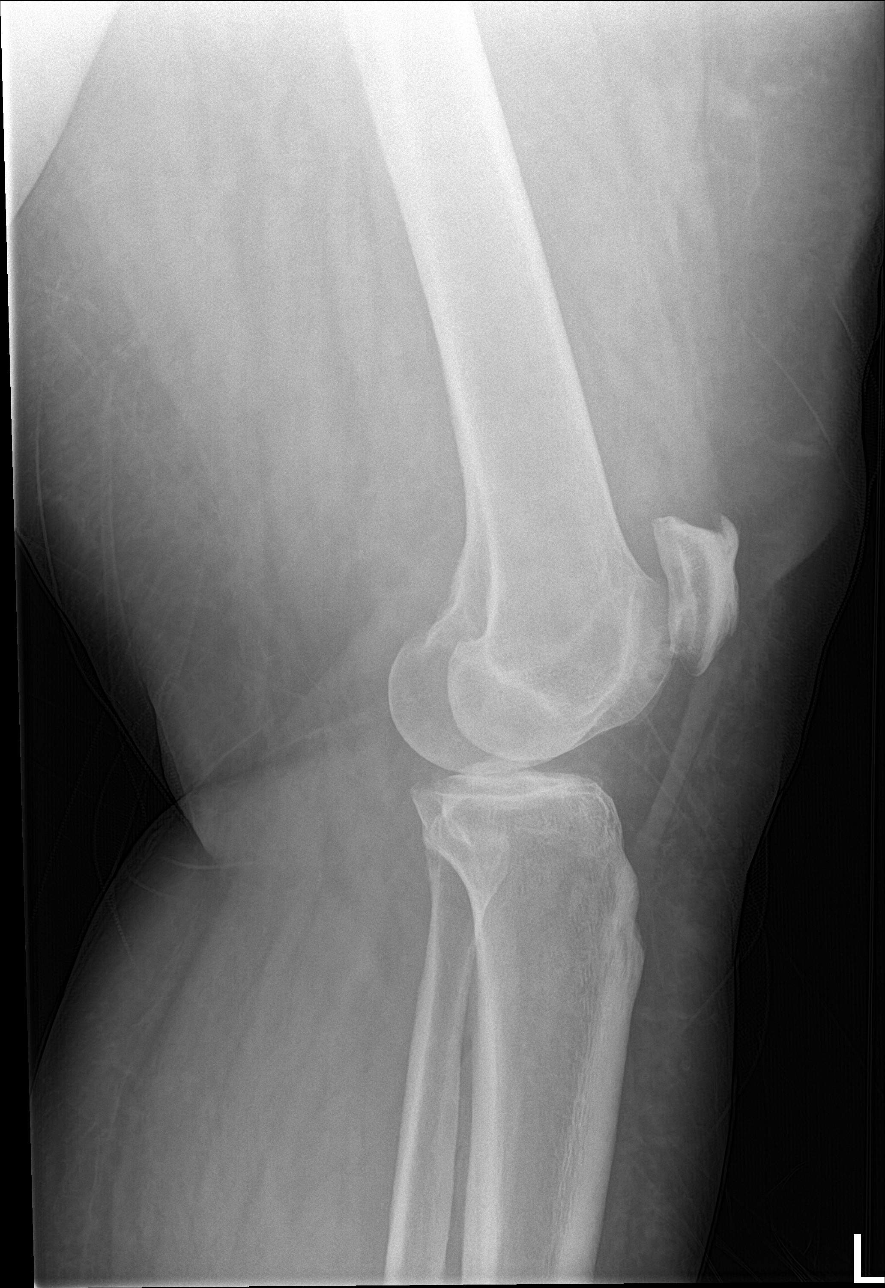

[knee obl (1 of 2)]
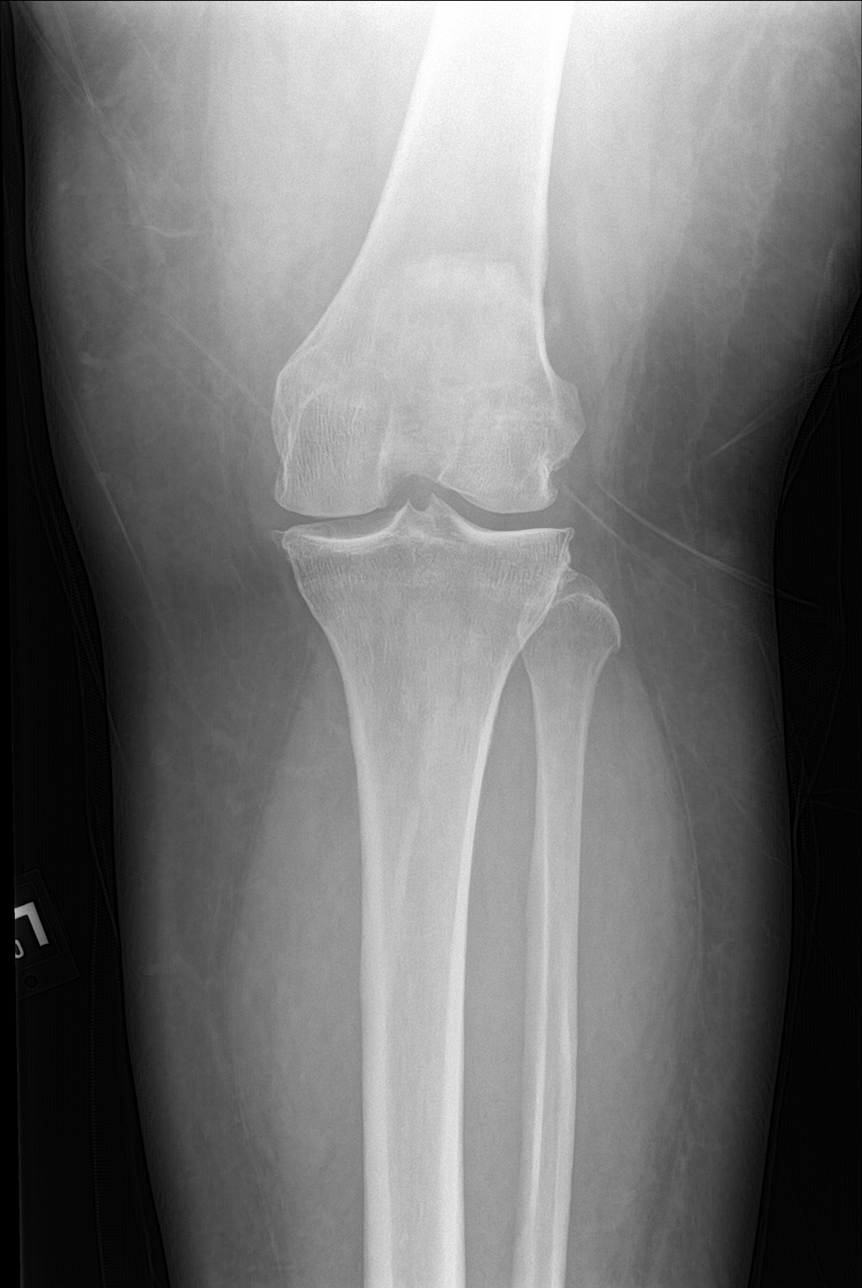

[knee obl (2 of 2)]
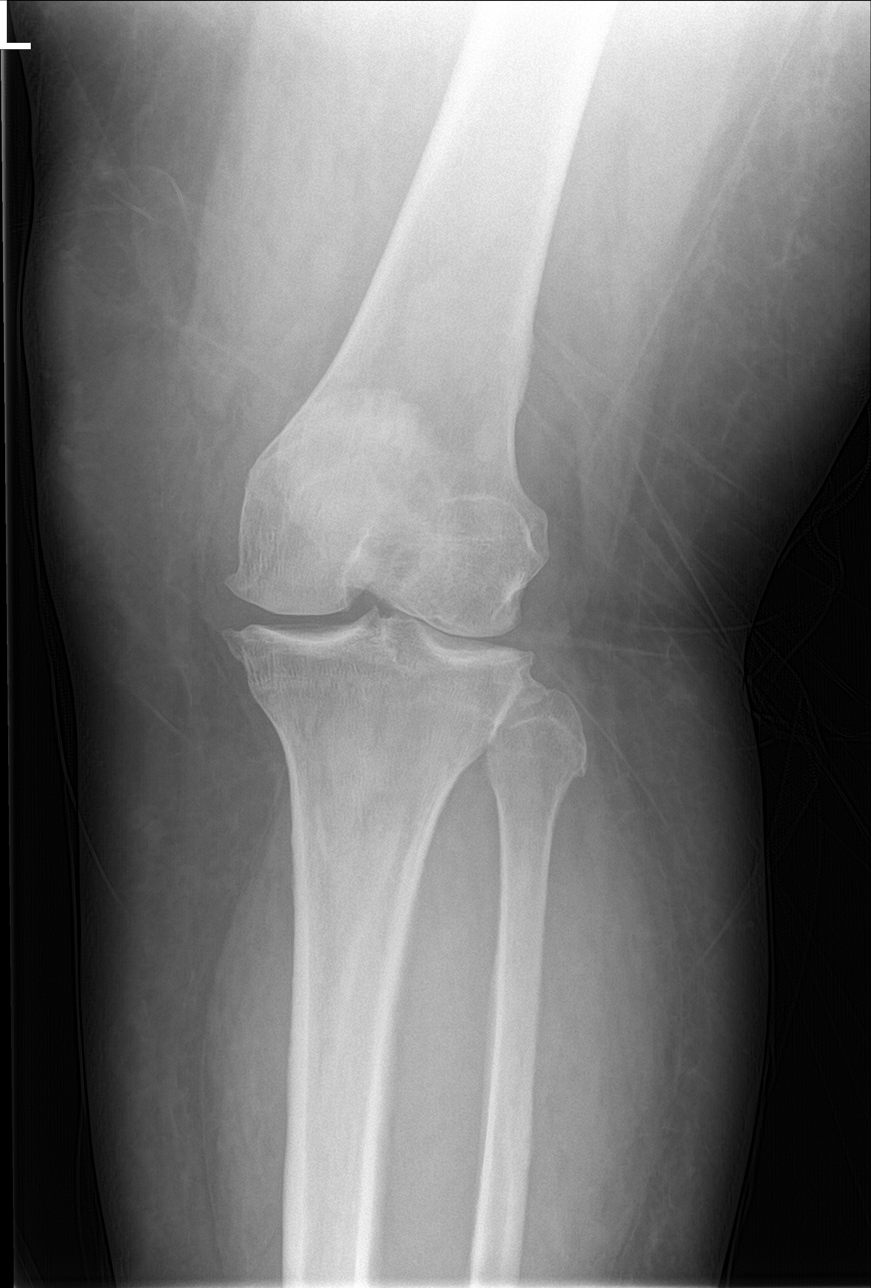

[4 of 4 positions shown; findings below may reference images not displayed]

FINDINGS: No fracture or dislocation is seen.

Mild tricompartmental degenerative changes.

Small suprapatellar knee joint effusion.

The visualized soft tissues are unremarkable.
IMPRESSION: No fracture or dislocation is seen.

Mild degenerative changes with small suprapatellar knee joint
effusion.

## 2019-04-21 IMAGING — CR DG CHEST 2V
2 series · 2 of 2 positions shown · non-contrast
Comparison: 02/29/2016 from prior radiographs

CLINICAL DATA: Cough and congestion for 1 day.

EXAM:
CHEST  2 VIEW

[w chest pa]
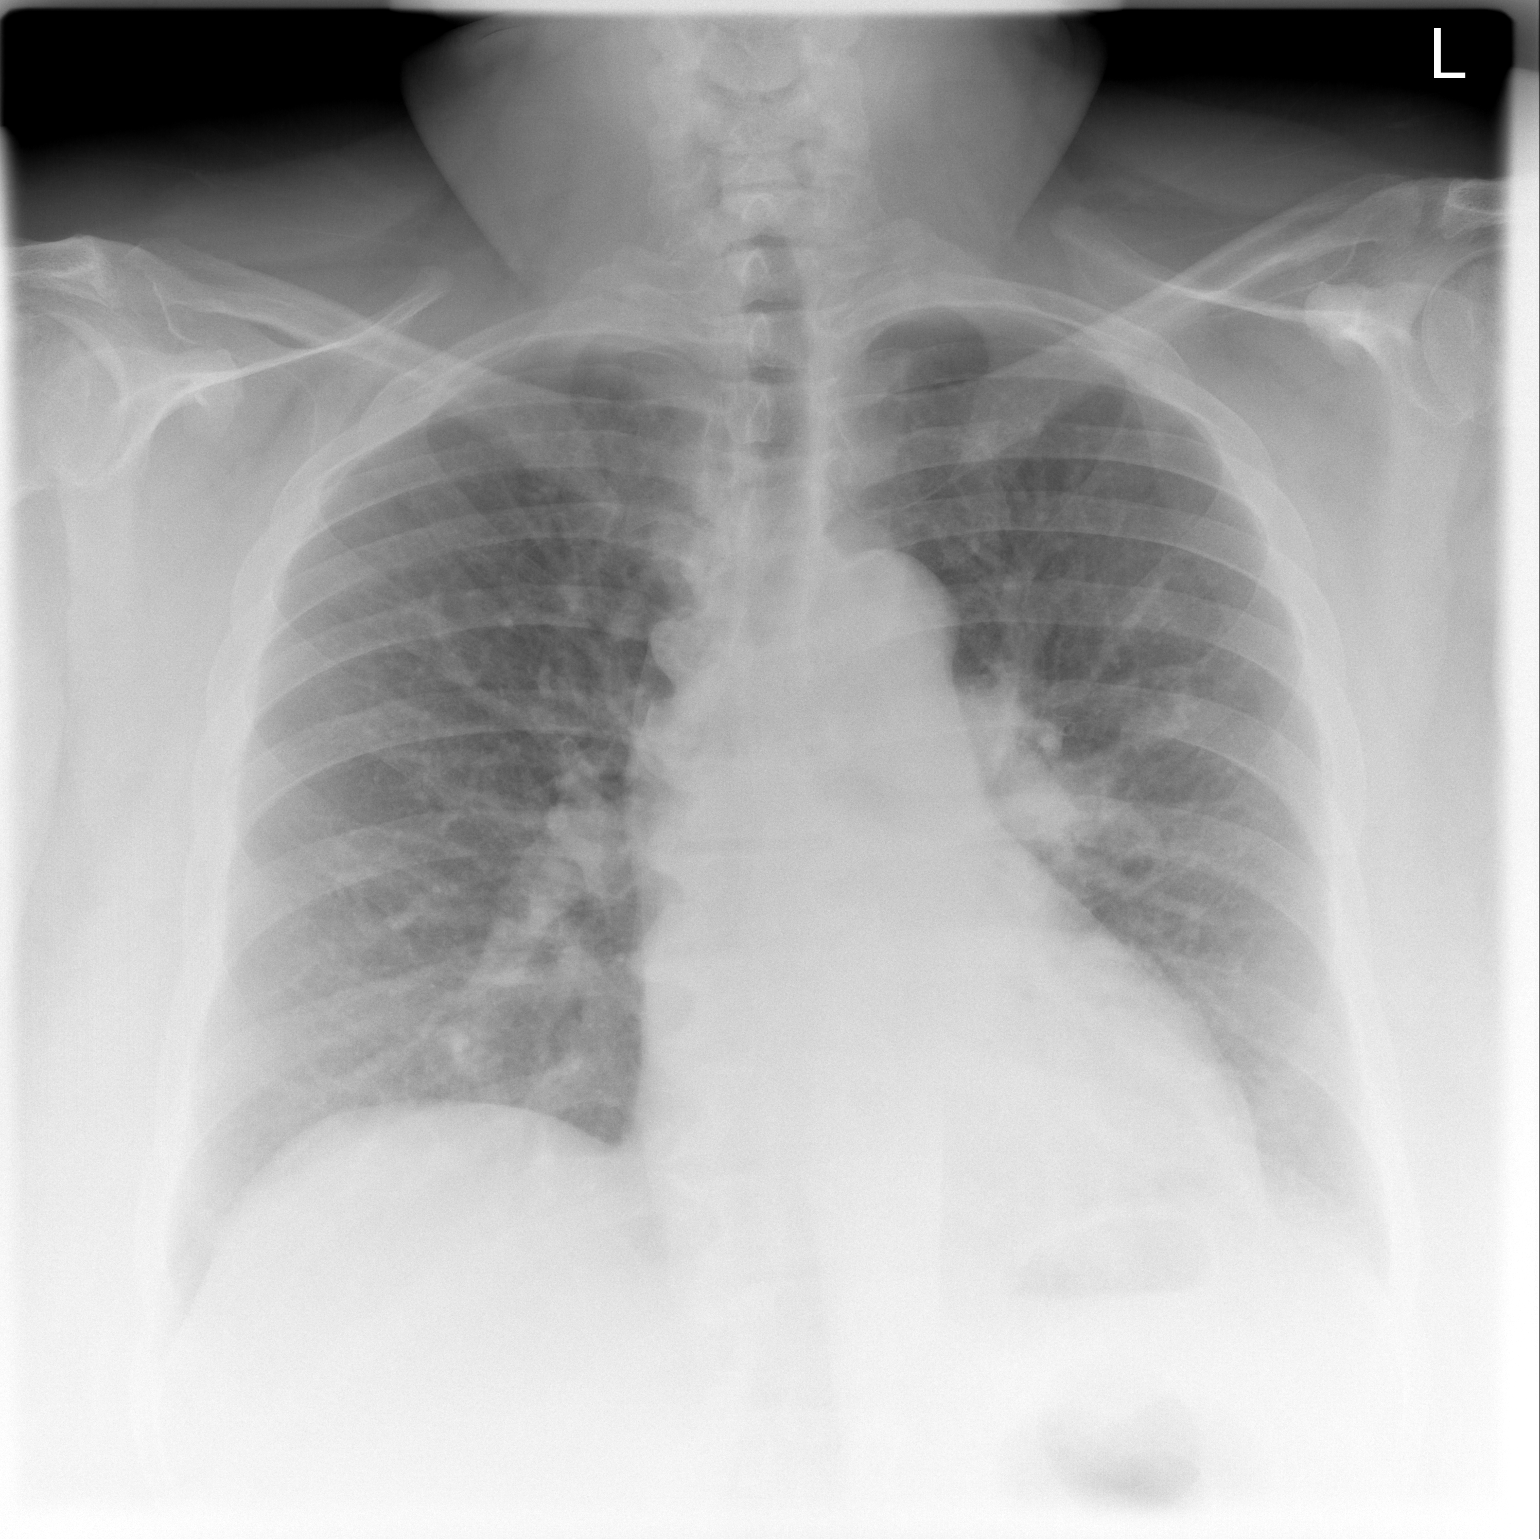

[w chest lat]
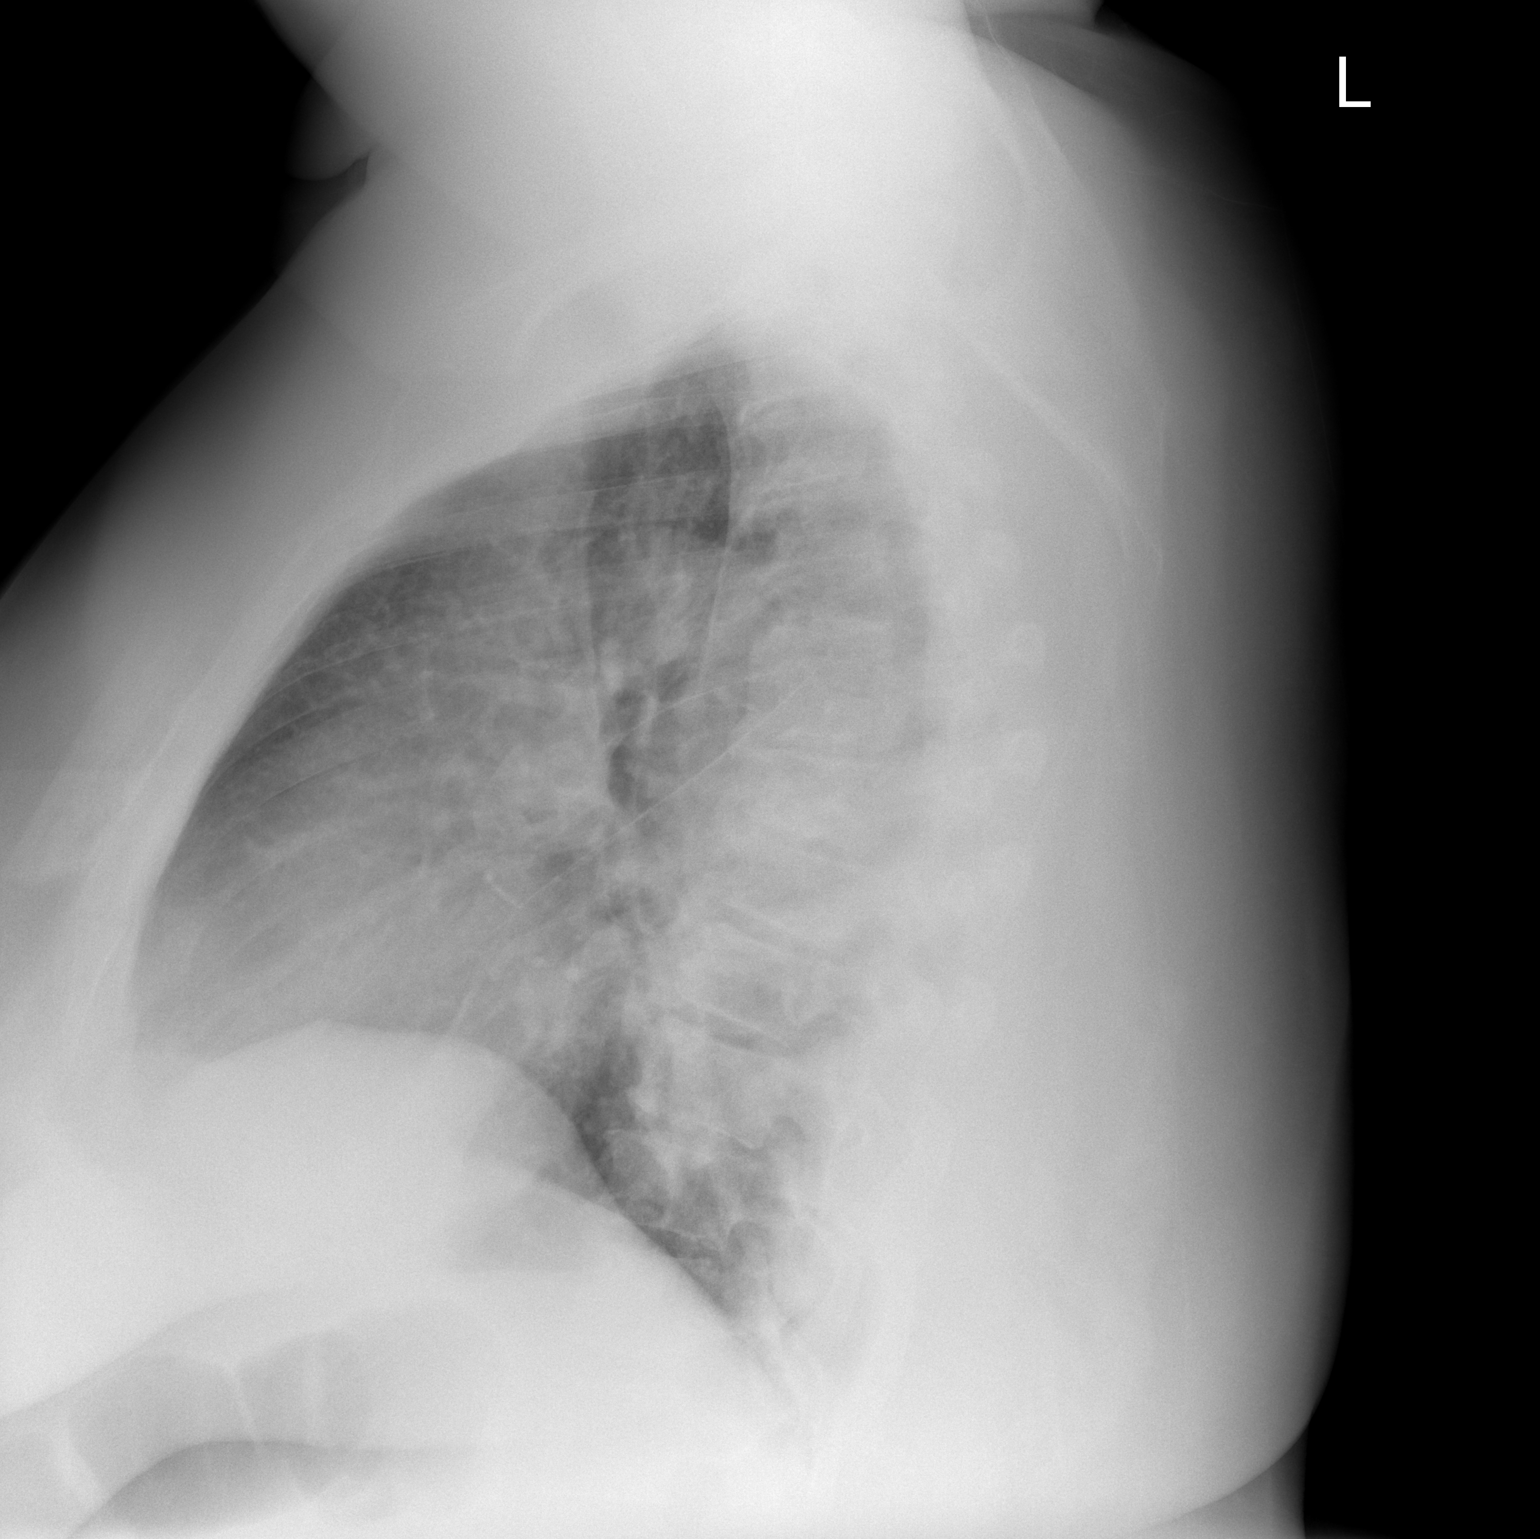

[2 of 2 positions shown; findings below may reference images not displayed]

FINDINGS: Cardiomegaly and peribronchial thickening again noted.

There is no evidence of focal airspace disease, pulmonary edema,
suspicious pulmonary nodule/mass, pleural effusion, or pneumothorax.
No acute bony abnormalities are identified.
IMPRESSION: Cardiomegaly without evidence of acute cardiopulmonary disease.

Chronic peribronchial thickening.

## 2019-04-21 IMAGING — CT CT ANGIO CHEST
2 of 8 series · 18 of 36 positions shown · IV contrast (isovue)
Comparison: Chest x-ray 07/15/2016, CT chest 12/09/2014

CLINICAL DATA: Chest pain shortness of breath and tachycardia

EXAM:
CT ANGIOGRAPHY CHEST WITH CONTRAST
TECHNIQUE: Multidetector CT imaging of the chest was performed using the
standard protocol during bolus administration of intravenous
contrast. Multiplanar CT image reconstructions and MIPs were
obtained to evaluate the vascular anatomy.
CONTRAST:  100 mL Isovue 370 intravenous

[Series 6: pe thins · axial · 0.78mm/px · z∈[+555,+834]mm · 17 of 414 slices shown]
[im 21/414  lung]
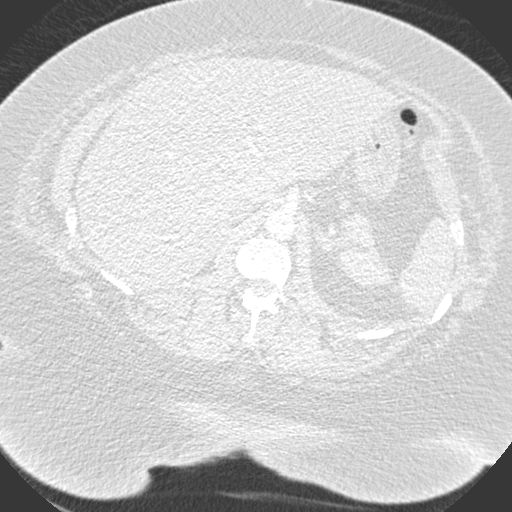
[im 42/414  mediastinal]
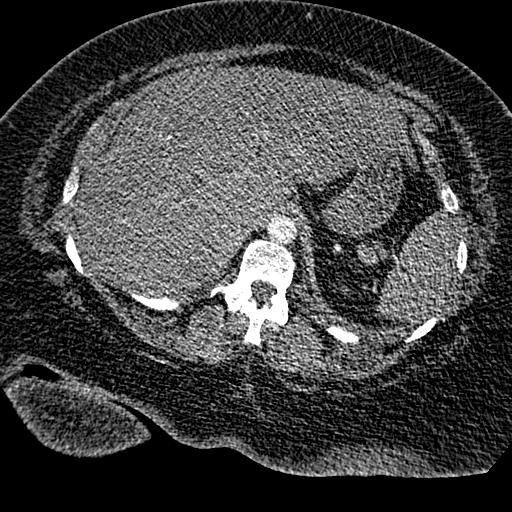
[im 62/414  lung]
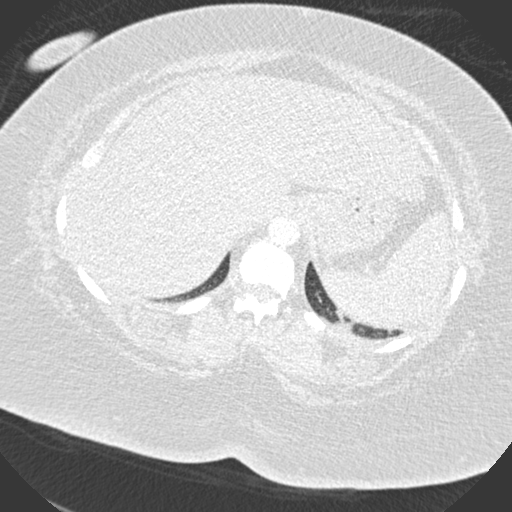
[im 83/414  mediastinal]
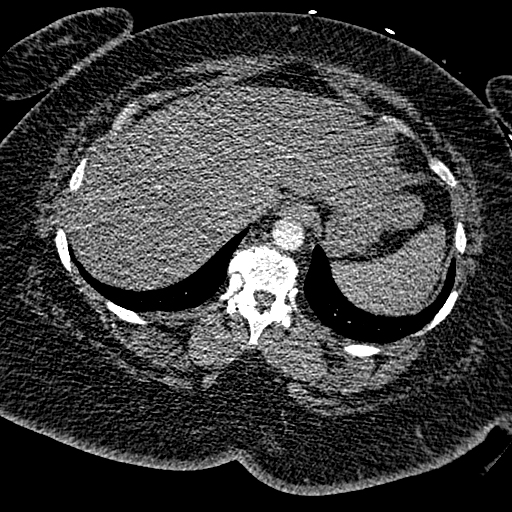
[im 124/414  lung]
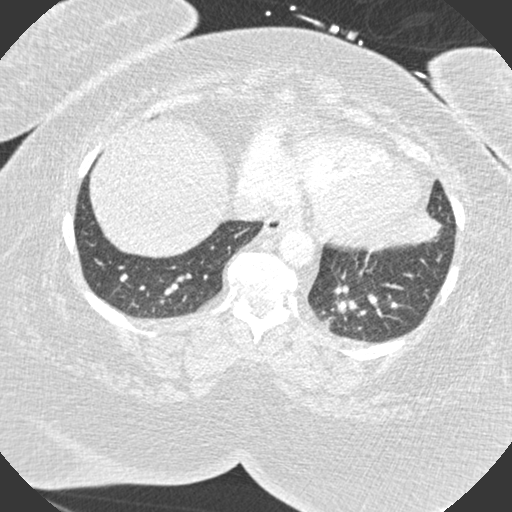
[im 145/414  mediastinal]
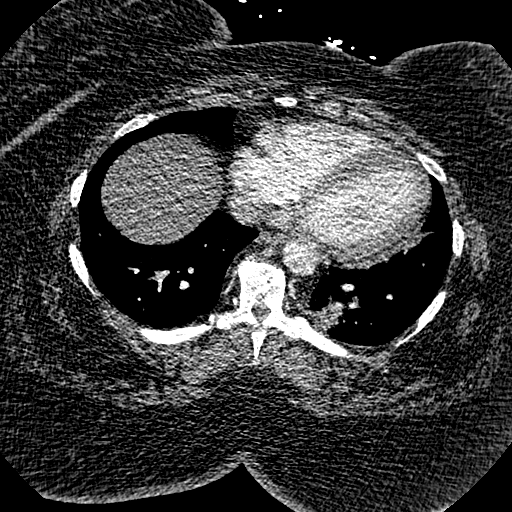
[im 166/414  lung]
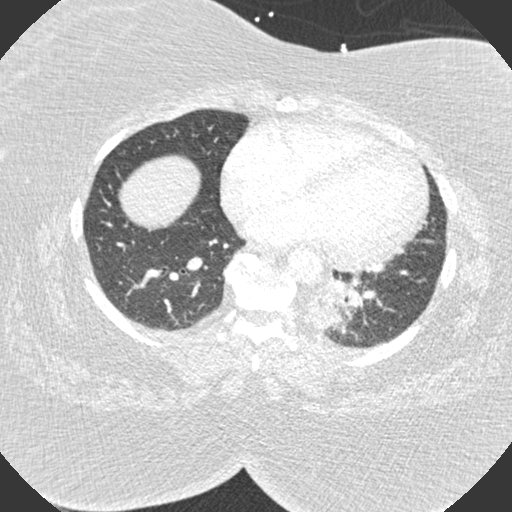
[im 186/414  mediastinal]
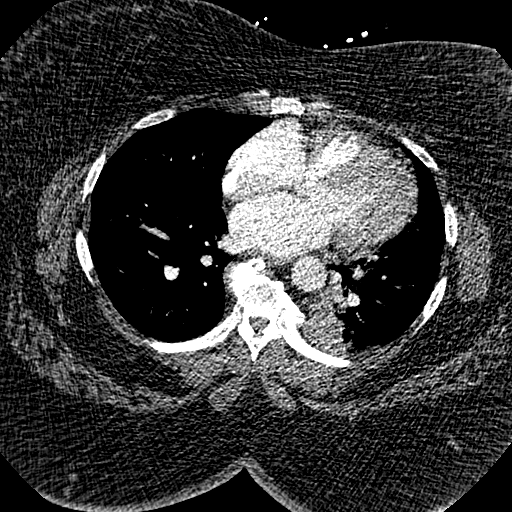
[im 207/414  lung]
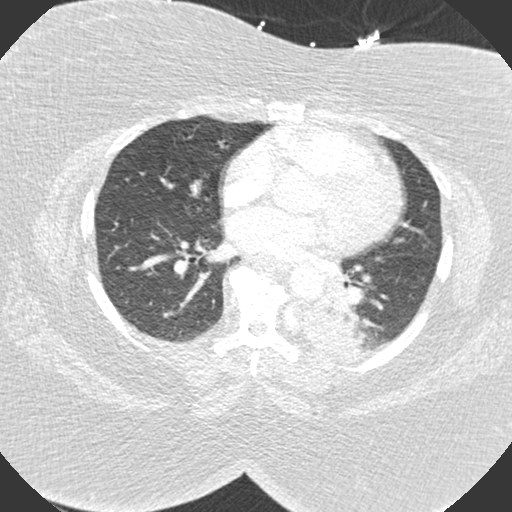
[im 228/414  mediastinal]
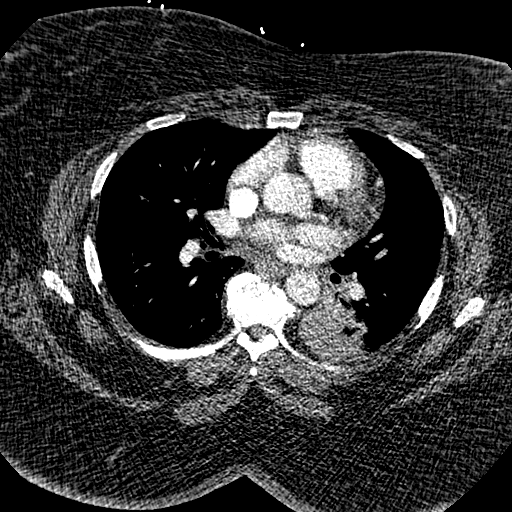
[im 248/414  lung]
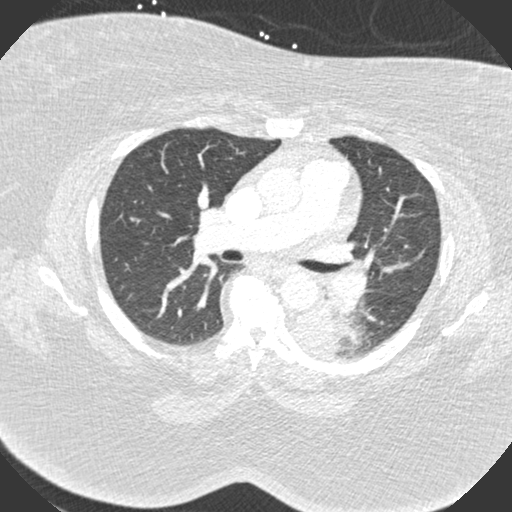
[im 269/414  mediastinal]
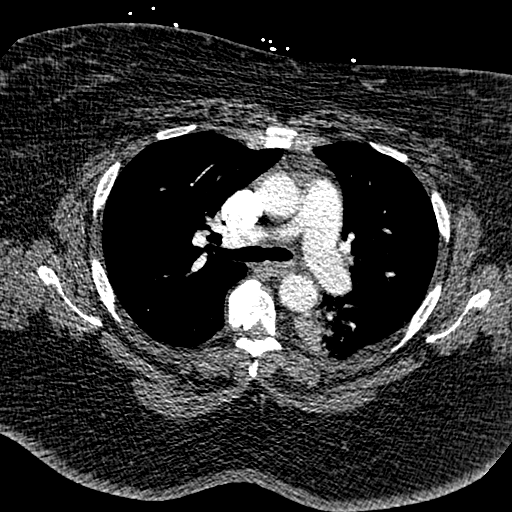
[im 290/414  lung]
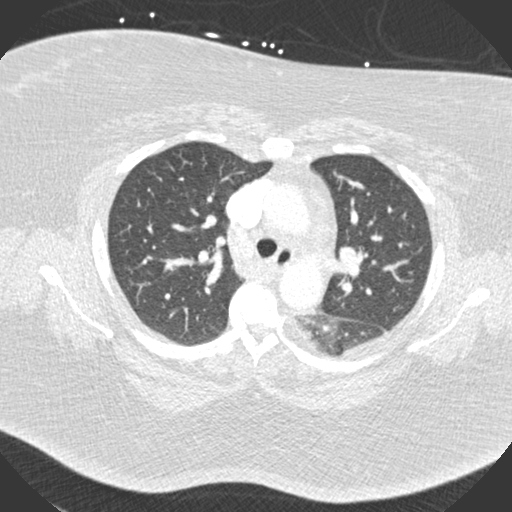
[im 331/414  mediastinal]
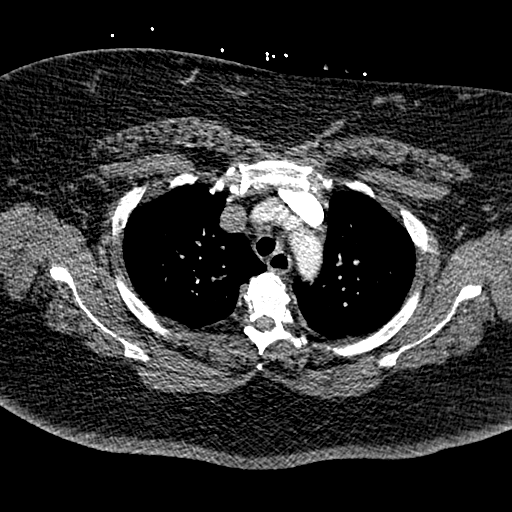
[im 352/414  lung]
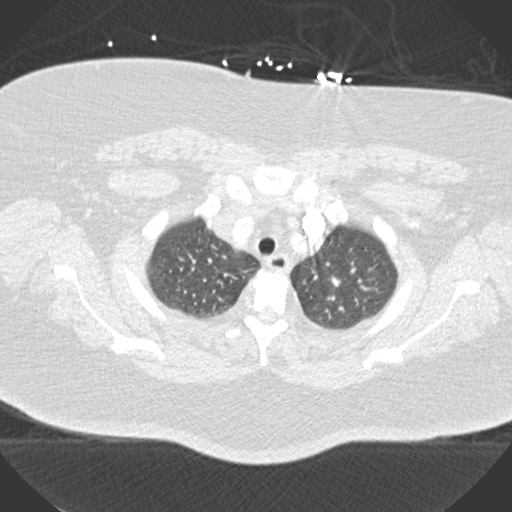
[im 372/414  mediastinal]
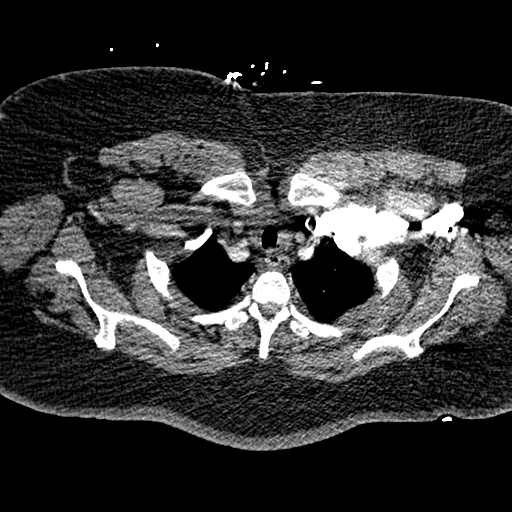
[im 393/414  lung]
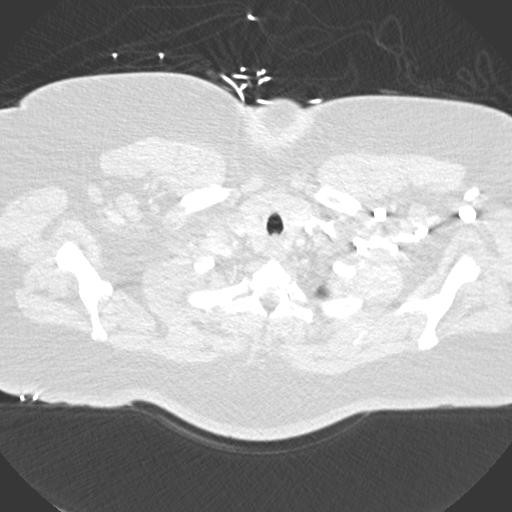

[Series 7: pe coronal mpr · coronal · 0.61mm/px · 1 of 102 slices shown]
[im 51/102  mediastinal]
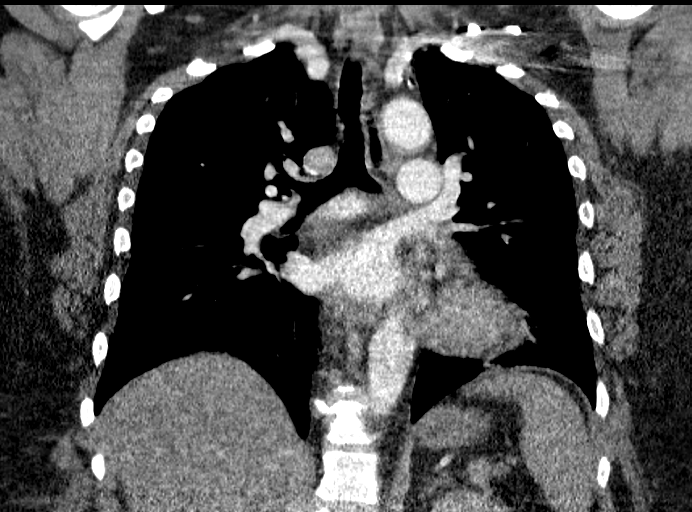

[18 of 36 positions shown; findings below may reference images not displayed]

FINDINGS: Cardiovascular: Suboptimal opacification of the pulmonary arteries.
Examination also limited by body habitus and grainy quality of
images. No definite central embolism. Non aneurysmal aorta. Heart
size upper normal. No pericardial effusion.

Mediastinum/Nodes: Midline trachea. Esophagus grossly unremarkable.
No evidence for a thyroid mass. Few prominent lymph nodes in the AP
window, measuring up to 7 mm.

Lungs/Pleura: Nodular ground-glass density in the left upper lobe,
measuring 9 mm, and consolidation in the left lower lobe with
surrounding ground-glass density and air bronchograms. No pleural
effusion.

Upper Abdomen: No acute abnormality.

Musculoskeletal: Flowing osteophytes of the thoracic spine. No acute
or suspicious bone lesion.

Review of the MIP images confirms the above findings.
IMPRESSION: 1. Technically limited study due to habitus and poor contrast
opacification of pulmonary arterial system. No gross embolus
visualized.
2. Partial consolidation in the left lower lobe with surrounding
ground-glass density is suspicious for a pneumonia. Focal nodular
ground-glass density in the left upper lobe, suspect also that this
is related to infection. Short interval CT follow-up in 6-12 weeks
may be considered following adequate antibiotic therapy.

## 2021-04-14 ENCOUNTER — Encounter (HOSPITAL_BASED_OUTPATIENT_CLINIC_OR_DEPARTMENT_OTHER): Payer: Self-pay | Admitting: Urology

## 2021-04-14 ENCOUNTER — Other Ambulatory Visit: Payer: Self-pay

## 2021-04-14 ENCOUNTER — Emergency Department (HOSPITAL_BASED_OUTPATIENT_CLINIC_OR_DEPARTMENT_OTHER)
Admission: EM | Admit: 2021-04-14 | Discharge: 2021-04-14 | Disposition: A | Discharge status: Home / Self Care | Payer: No Typology Code available for payment source | Attending: Emergency Medicine | Admitting: Emergency Medicine

## 2021-04-14 ENCOUNTER — Emergency Department (HOSPITAL_BASED_OUTPATIENT_CLINIC_OR_DEPARTMENT_OTHER): Payer: No Typology Code available for payment source

## 2021-04-14 DIAGNOSIS — Z79899 Other long term (current) drug therapy: Secondary | ICD-10-CM | POA: Insufficient documentation

## 2021-04-14 DIAGNOSIS — M25511 Pain in right shoulder: Secondary | ICD-10-CM | POA: Insufficient documentation

## 2021-04-14 DIAGNOSIS — I10 Essential (primary) hypertension: Secondary | ICD-10-CM | POA: Diagnosis not present

## 2021-04-14 MED ORDER — OXYCODONE-ACETAMINOPHEN 5-325 MG PO TABS: 1.0000 | ORAL_TABLET | Freq: Three times a day (TID) | ORAL | 0 refills | Status: AC | PRN

## 2021-04-14 MED ORDER — OXYCODONE-ACETAMINOPHEN 5-325 MG PO TABS
1.0000 | ORAL_TABLET | Freq: Once | ORAL | Status: AC
  Administered 2021-04-14: 16:00:00 1 via ORAL
  Filled 2021-04-14: qty 1

## 2021-04-14 NOTE — Discharge Instructions (Signed)
Your shoulder x-ray did not show any evidence of fracture.  Likely have a shoulder strain.  Given that you are unable to take steroids and NSAIDs due to your gastric bypass I have given you a short course of Percocet to keep on hand for severe pain.  Continue taking Tylenol for pain control.  I have also provided you a sling and follow-up to orthopedics.  If you have worsening of your symptoms you can return to the emergency room otherwise follow-up with orthopedics.

## 2021-04-14 NOTE — ED Provider Notes (Addendum)
[facility] EMERGENCY DEPARTMENT Provider Note   CSN: [id number] Arrival date & time: 04/14/21  1343     History  Chief Complaint  Patient presents with   Shoulder Injury    Sandra Knight is a 51 y.o. female.  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  Patient is an employee either.  Patient is right-hand dominant.  Patient reports as she was going up the ramp she felt the patient was getting up so she made a sudden move and had sudden onset of pain however patient was able to continue to get the patient in a minivan and secured the patient down transport patient and when she returned to work she discussed it with a coworker and reported it to management and was referred to the emergency room.  She denies prior injuries to this shoulder.  Denies numbness or tingling.  She has taken Tylenol with minimal relief.  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  The history is provided by the patient. No language interpreter was used.  Shoulder Injury      Home Medications Prior to Admission medications   Medication Sig Start Date End Date Taking? Authorizing Provider  benzonatate (TESSALON) 100 MG capsule Take 1 capsule (100 mg total) by mouth every 8 (eight) hours. Patient not taking: Reported on 07/16/2016 02/29/16   Hedges, Dellis Filbert, PA-C  fluticasone [facility]) 50 MCG/ACT nasal spray Place 2 sprays into both nostrils daily. Patient not taking: Reported on 07/16/2016 02/29/16   Hedges, Dellis Filbert, PA-C  furosemide (LASIX) 40 MG tablet Take 40 mg by mouth daily.     [provider]  levofloxacin (LEVAQUIN) 500 MG tablet Take 1 tablet (500 mg total) by mouth daily. 07/17/16   Charlynne Cousins, MD  metoprolol (LOPRESSOR) 100 MG tablet Take 100 mg by mouth 2 (two) times daily. 05/12/16 05/12/17  [provider]  naproxen (NAPROSYN) 500 MG tablet  Take 1 tablet (500 mg total) by mouth 2 (two) times daily with a meal. 06/26/16   Avie Echevaria B, PA-C      Allergies    Nsaids    Review of Systems   Review of Systems  Constitutional:  Negative for chills and fever.  Musculoskeletal:  Positive for arthralgias. Negative for joint swelling and neck pain.  Neurological:  Negative for weakness.  All other systems reviewed and are negative.  Physical Exam Updated Vital Signs BP (!) 205/94 (BP Location: Left Arm)    Pulse 64    Temp 98 F (36.7 C) (Oral)    Resp 18    Ht 5\' 9"  (1.753 m)    Wt (!) 190.1 kg    LMP 10/25/2011    SpO2 100%    BMI 61.89 kg/m  Physical Exam Vitals and nursing note reviewed.  Constitutional:      General: She is not in acute distress.    Appearance: Normal appearance. She is not ill-appearing.  HENT:     Head: Normocephalic and atraumatic.     Nose: Nose normal.  Eyes:     Conjunctiva/sclera: Conjunctivae normal.  Pulmonary:     Effort: Pulmonary effort is normal. No respiratory distress.  Musculoskeletal:        General: No deformity.     Cervical back: Normal range of motion.     Comments: Right shoulder without visible deformity.  Active range of motion intact but  painful with full flexion, AB duction.  Tenderness to palpation present over right shoulder joint.  Grip strength and shoulder strength 5/5 and symmetrical.  Radial pulse 2+ and symmetrical.  Sensation intact.  Skin:    Findings: No rash.  Neurological:     Mental Status: She is alert.    ED Results / Procedures / Treatments   Labs (all labs ordered are listed, but only abnormal results are displayed) Labs Reviewed - No data to display  EKG None  Radiology DG Shoulder Right  Result Date: 04/14/2021 CLINICAL DATA:  shoulder injury EXAM: RIGHT SHOULDER - 2+ VIEW COMPARISON:  None. FINDINGS: Normal alignment. No acute fracture. Moderate degenerative changes of the glenohumeral and AC joints. Normal mineralization. The soft tissues  are unremarkable. IMPRESSION: No acute osseous abnormality. Moderate degenerative changes of the shoulder as described. Electronically Signed   By: Albin Felling M.D.   On: 04/14/2021 14:21    Procedures Procedures    Medications Ordered in ED Medications - No data to display  ED Course/ Medical Decision Making/ A&P                           Medical Decision Making Amount and/or Complexity of Data Reviewed Radiology: ordered.  Risk Prescription drug management.   Medical Decision Making / ED Course   This patient presents to the ED for concern of right shoulder pain, this involves an extensive number of treatment options, and is a complaint that carries with it a high risk of complications and morbidity.  The differential diagnosis includes fracture, strain, dislocation  MDM: 51 year old female presents today for right shoulder pain following morning a patient into the back of the minivan.  Right shoulder x-rays negative for fracture.  Neurovascularly intact in the right upper extremity.  Mild tenderness palpation present over the right lateral shoulder.  Range of motion intact but painful at extremes of range of motion.  Will provide with sling and Ortho follow-up.  Patient voices understanding and is in agreement with plan. Given patient is unable to tolerate steroids, or NSAIDs due to gastric bypass we will provide with short course of Percocet to keep on hand for severe breakthrough pain.  Lab Tests: -I ordered, reviewed, and interpreted labs.   The pertinent results include:   Labs Reviewed - No data to display    EKG  EKG Interpretation  Date/Time:    Ventricular Rate:    PR Interval:    QRS Duration:   QT Interval:    QTC Calculation:   R Axis:     Text Interpretation:           Imaging Studies ordered: I ordered imaging studies including right shoulder x-ray I independently visualized and interpreted imaging. I agree with the radiologist  interpretation   Medicines ordered and prescription drug management: Meds ordered this encounter  Medications   oxyCODONE-acetaminophen (PERCOCET/ROXICET) 5-325 MG per tablet 1 tablet    -I have reviewed the patients home medicines and have made adjustments as needed   Reevaluation: After the interventions noted above, I reevaluated the patient and found that they have :improved  Co morbidities that complicate the patient evaluation  Past Medical History:  Diagnosis Date   Fibroids, intramural    Hypertension    Iron deficiency anemia    Mixed hyperlipidemia    Morbid obesity with BMI of 60.0-69.9, adult ([place])       Dispostion: Patient appropriate for discharge.  Patient  discharged in stable condition.   Final Clinical Impression(s) / ED Diagnoses Final diagnoses:  Acute pain of right shoulder    Rx / DC Orders ED Discharge Orders          Ordered    oxyCODONE-acetaminophen (PERCOCET/ROXICET) 5-325 MG tablet  Every 8 hours PRN        04/14/21 1559              Evlyn Courier, PA-C 04/14/21 1618    Evlyn Courier, PA-C 04/14/21 1628    Lucrezia Starch, MD 04/14/21 2131

## 2021-04-14 NOTE — ED Triage Notes (Signed)
Right shoulder injury while pushing a large pt in wheelchair approx 1 hr pta  Ice on shoulder on arrival, tylenol at 1250 Limited ROM r/t pain

## 2024-01-19 IMAGING — DX DG SHOULDER 2+V*R*
3 series · 3 of 3 positions shown · non-contrast
Comparison: None.

CLINICAL DATA: shoulder injury

EXAM:
RIGHT SHOULDER - 2+ VIEW

[shoulder grashey]
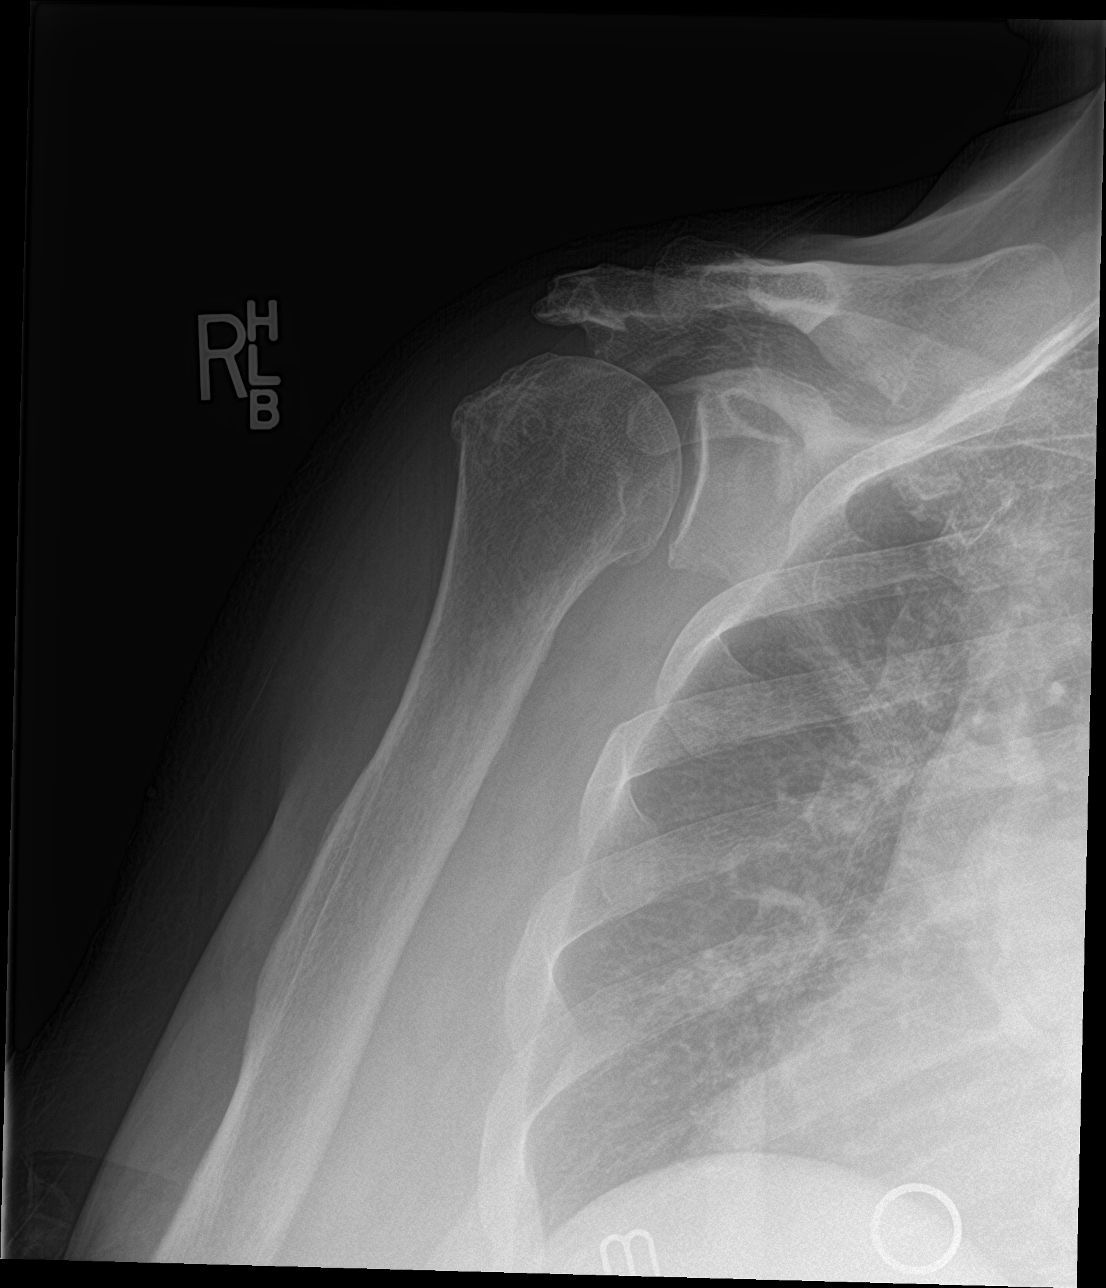

[shoulder y view]
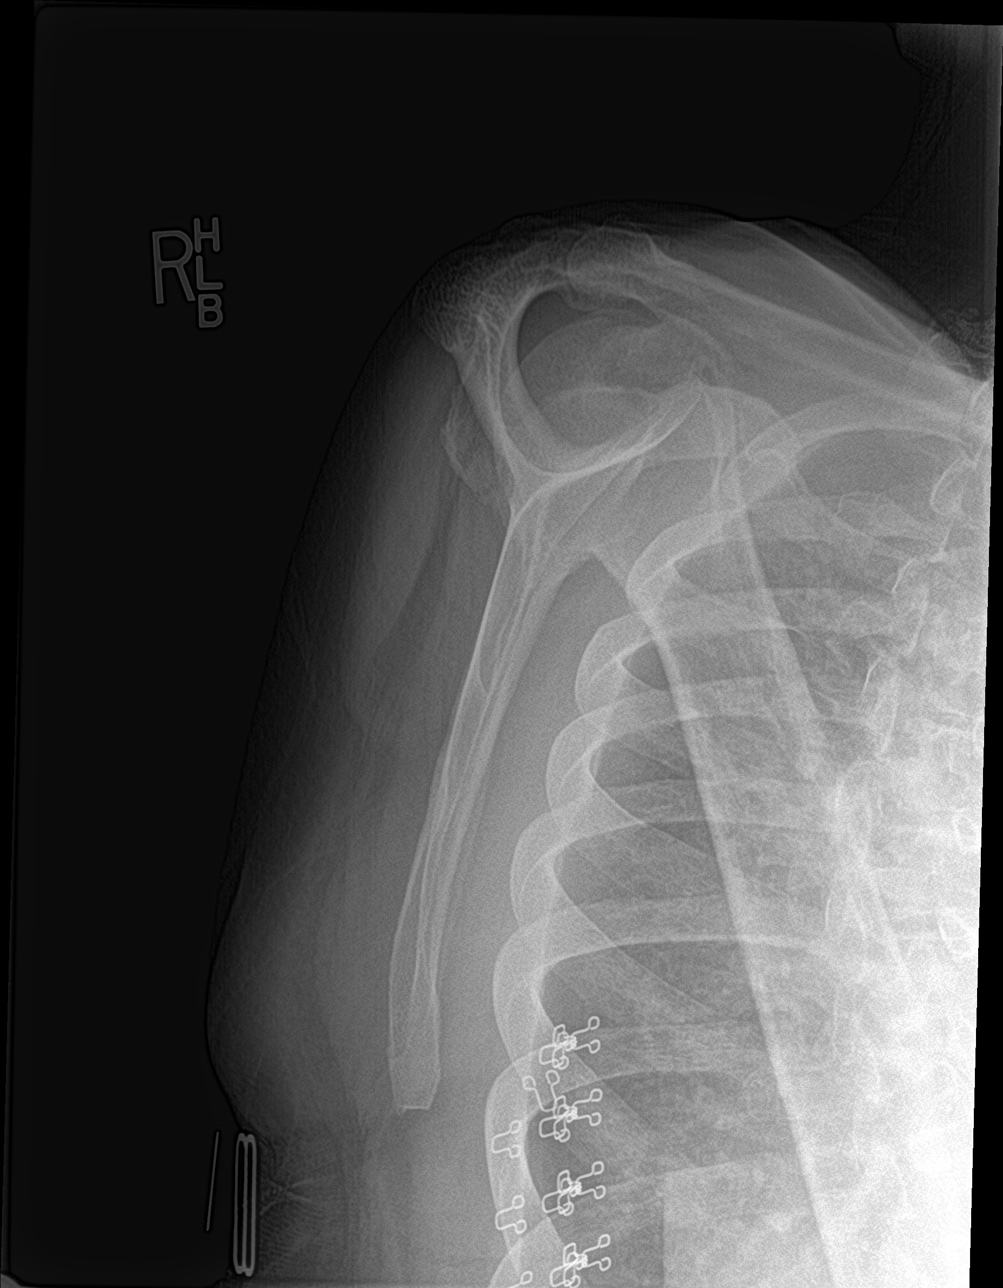

[shoulder axillary]
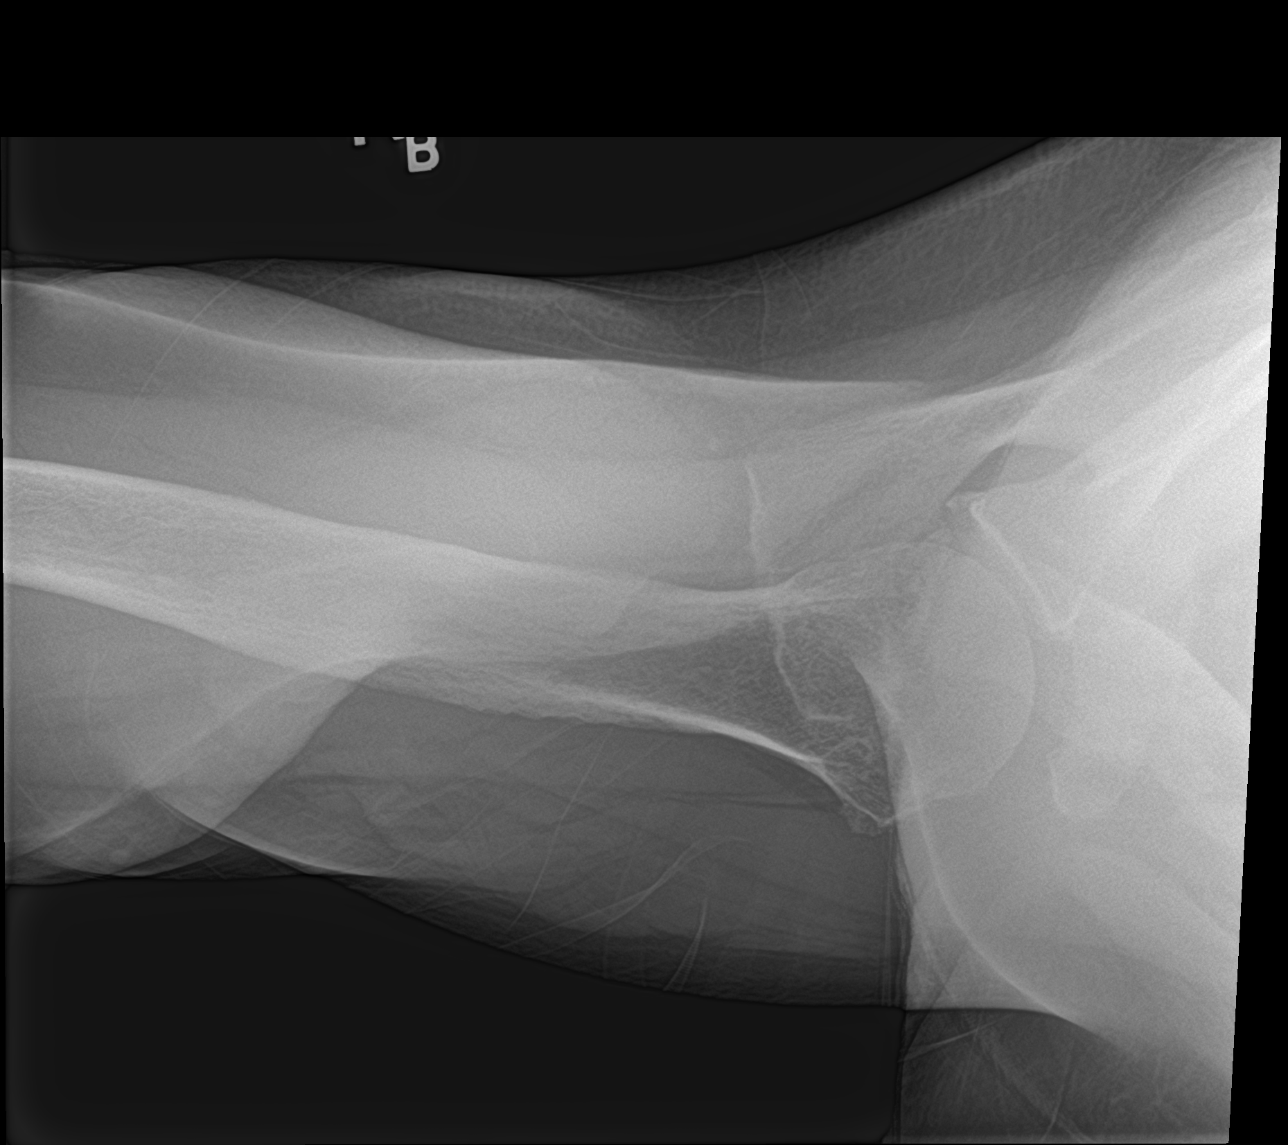

[3 of 3 positions shown; findings below may reference images not displayed]

FINDINGS: Normal alignment. No acute fracture. Moderate degenerative changes
of the glenohumeral and AC joints. Normal mineralization. The soft
tissues are unremarkable.
IMPRESSION: No acute osseous abnormality. Moderate degenerative changes of the
shoulder as described.
